# Patient Record
Sex: Male | Born: 1970 | Race: Black or African American | Hispanic: No | Marital: Married | Smoking: Former smoker
Health system: Southern US, Community
[De-identification: ages and names within clinical notes are randomized; demographics above are authoritative.]

## PROBLEM LIST (undated history)

## (undated) DIAGNOSIS — I639 Cerebral infarction, unspecified: Secondary | ICD-10-CM

## (undated) DIAGNOSIS — E7800 Pure hypercholesterolemia, unspecified: Secondary | ICD-10-CM

## (undated) DIAGNOSIS — R519 Headache, unspecified: Secondary | ICD-10-CM

## (undated) DIAGNOSIS — I10 Essential (primary) hypertension: Secondary | ICD-10-CM

## (undated) DIAGNOSIS — R51 Headache: Secondary | ICD-10-CM

## (undated) DIAGNOSIS — G4730 Sleep apnea, unspecified: Secondary | ICD-10-CM

## (undated) HISTORY — PX: NO PAST SURGERIES: SHX2092

## (undated) HISTORY — DX: Sleep apnea, unspecified: G47.30

---

## 2012-03-01 DIAGNOSIS — I639 Cerebral infarction, unspecified: Secondary | ICD-10-CM

## 2012-03-01 HISTORY — DX: Cerebral infarction, unspecified: I63.9

## 2016-03-22 ENCOUNTER — Ambulatory Visit: Payer: Self-pay | Admitting: Nurse Practitioner

## 2016-03-29 ENCOUNTER — Ambulatory Visit: Payer: Self-pay | Admitting: Nurse Practitioner

## 2017-04-24 ENCOUNTER — Emergency Department (HOSPITAL_COMMUNITY): Payer: Self-pay

## 2017-04-24 ENCOUNTER — Emergency Department (HOSPITAL_COMMUNITY)
Admission: EM | Admit: 2017-04-24 | Discharge: 2017-04-24 | Disposition: A | Discharge status: Home / Self Care | Payer: Self-pay | Attending: Emergency Medicine | Admitting: Emergency Medicine

## 2017-04-24 ENCOUNTER — Encounter (HOSPITAL_COMMUNITY): Payer: Self-pay | Admitting: Emergency Medicine

## 2017-04-24 DIAGNOSIS — J101 Influenza due to other identified influenza virus with other respiratory manifestations: Secondary | ICD-10-CM

## 2017-04-24 DIAGNOSIS — R509 Fever, unspecified: Secondary | ICD-10-CM | POA: Insufficient documentation

## 2017-04-24 DIAGNOSIS — I10 Essential (primary) hypertension: Secondary | ICD-10-CM | POA: Insufficient documentation

## 2017-04-24 DIAGNOSIS — R000 Tachycardia, unspecified: Secondary | ICD-10-CM | POA: Insufficient documentation

## 2017-04-24 DIAGNOSIS — Z8673 Personal history of transient ischemic attack (TIA), and cerebral infarction without residual deficits: Secondary | ICD-10-CM | POA: Insufficient documentation

## 2017-04-24 DIAGNOSIS — J09X9 Influenza due to identified novel influenza A virus with other manifestations: Secondary | ICD-10-CM | POA: Insufficient documentation

## 2017-04-24 DIAGNOSIS — F17210 Nicotine dependence, cigarettes, uncomplicated: Secondary | ICD-10-CM | POA: Insufficient documentation

## 2017-04-24 DIAGNOSIS — E860 Dehydration: Secondary | ICD-10-CM | POA: Insufficient documentation

## 2017-04-24 HISTORY — DX: Cerebral infarction, unspecified: I63.9

## 2017-04-24 HISTORY — DX: Headache, unspecified: R51.9

## 2017-04-24 HISTORY — DX: Essential (primary) hypertension: I10

## 2017-04-24 HISTORY — DX: Pure hypercholesterolemia, unspecified: E78.00

## 2017-04-24 HISTORY — DX: Headache: R51

## 2017-04-24 LAB — INFLUENZA PANEL BY PCR (TYPE A & B)
INFLAPCR: POSITIVE — AB
Influenza B By PCR: NEGATIVE

## 2017-04-24 LAB — BASIC METABOLIC PANEL
ANION GAP: 12 (ref 5–15)
BUN: 8 mg/dL (ref 6–20)
CO2: 24 mmol/L (ref 22–32)
Calcium: 9.3 mg/dL (ref 8.9–10.3)
Chloride: 98 mmol/L — ABNORMAL LOW (ref 101–111)
Creatinine, Ser: 1.09 mg/dL (ref 0.61–1.24)
GFR calc Af Amer: >60 mL/min (ref >60)
Glucose, Bld: 108 mg/dL — ABNORMAL HIGH (ref 65–99)
POTASSIUM: 3.8 mmol/L (ref 3.5–5.1)
SODIUM: 134 mmol/L — AB (ref 135–145)

## 2017-04-24 LAB — CBC WITH DIFFERENTIAL/PLATELET
BASOS ABS: 0 10*3/uL (ref 0.0–0.1)
BASOS PCT: 0 %
EOS PCT: 0 %
Eosinophils Absolute: 0 10*3/uL (ref 0.0–0.7)
HCT: 45.6 % (ref 39.0–52.0)
Hemoglobin: 15.7 g/dL (ref 13.0–17.0)
Lymphocytes Relative: 10 %
Lymphs Abs: 0.9 10*3/uL (ref 0.7–4.0)
MCH: 32.6 pg (ref 26.0–34.0)
MCHC: 34.4 g/dL (ref 30.0–36.0)
MCV: 94.6 fL (ref 78.0–100.0)
Monocytes Absolute: 0.4 10*3/uL (ref 0.1–1.0)
Monocytes Relative: 5 %
NEUTROS ABS: 7.4 10*3/uL (ref 1.7–7.7)
Neutrophils Relative %: 85 %
PLATELETS: 268 10*3/uL (ref 150–400)
RBC: 4.82 MIL/uL (ref 4.22–5.81)
RDW: 12.9 % (ref 11.5–15.5)
WBC: 8.7 10*3/uL (ref 4.0–10.5)

## 2017-04-24 LAB — RAPID URINE DRUG SCREEN, HOSP PERFORMED
AMPHETAMINES: NOT DETECTED
BENZODIAZEPINES: NOT DETECTED
Barbiturates: NOT DETECTED
COCAINE: NOT DETECTED
Opiates: NOT DETECTED
Tetrahydrocannabinol: NOT DETECTED

## 2017-04-24 LAB — URINALYSIS, ROUTINE W REFLEX MICROSCOPIC
BILIRUBIN URINE: NEGATIVE
GLUCOSE, UA: NEGATIVE mg/dL
HGB URINE DIPSTICK: NEGATIVE
Ketones, ur: NEGATIVE mg/dL
Leukocytes, UA: NEGATIVE
Nitrite: NEGATIVE
PH: 5 (ref 5.0–8.0)
Protein, ur: NEGATIVE mg/dL
SPECIFIC GRAVITY, URINE: 1.021 (ref 1.005–1.030)

## 2017-04-24 MED ORDER — OSELTAMIVIR PHOSPHATE 75 MG PO CAPS: 75.0000 mg | ORAL_CAPSULE | Freq: Two times a day (BID) | ORAL | 0 refills | Status: DC

## 2017-04-24 MED ORDER — ONDANSETRON HCL 4 MG/2ML IJ SOLN
4.0000 mg | Freq: Once | INTRAMUSCULAR | Status: DC
Start: 2017-04-24 — End: 2017-04-24

## 2017-04-24 MED ORDER — HYDROCODONE-ACETAMINOPHEN 5-325 MG PO TABS: 1.0000 | ORAL_TABLET | ORAL | 0 refills | Status: DC | PRN

## 2017-04-24 MED ORDER — ACETAMINOPHEN 500 MG PO TABS
1000.0000 mg | ORAL_TABLET | Freq: Once | ORAL | Status: AC
  Administered 2017-04-24: 1000 mg via ORAL
  Filled 2017-04-24: qty 2

## 2017-04-24 MED ORDER — SODIUM CHLORIDE 0.9 % IV SOLN: INTRAVENOUS | Status: DC

## 2017-04-24 MED ORDER — MORPHINE SULFATE (PF) 4 MG/ML IV SOLN: 4.0000 mg | Freq: Once | INTRAVENOUS | Status: DC

## 2017-04-24 MED ORDER — IBUPROFEN 600 MG PO TABS: 600.0000 mg | ORAL_TABLET | Freq: Four times a day (QID) | ORAL | 0 refills | Status: DC | PRN

## 2017-04-24 MED ORDER — SODIUM CHLORIDE 0.9 % IV BOLUS (SEPSIS)
1000.0000 mL | Freq: Once | INTRAVENOUS | Status: AC
  Administered 2017-04-24: 1000 mL via INTRAVENOUS

## 2017-04-24 MED ORDER — IBUPROFEN 800 MG PO TABS
800.0000 mg | ORAL_TABLET | Freq: Once | ORAL | Status: AC
  Administered 2017-04-24: 800 mg via ORAL
  Filled 2017-04-24: qty 1

## 2017-04-24 NOTE — ED Provider Notes (Signed)
[place] EMERGENCY DEPARTMENT Provider Note   CSN: [id number] Arrival date & time: 04/24/17  1114     History   Chief Complaint Chief Complaint  Patient presents with  . Seizures    HPI Aaron Crane is a 47 y.o. male.  Pt presents to the ED today with possible seizure.  He developed a fever yesterday and has not been feeling well.   The pt sat up in bed today to drink and take some meds, when his eyes rolled back and his body started shaking.  Episode lasted 8 seconds and pt awake and alert right afterwards.  No incontinence.  No seizure hx.      Past Medical History:  Diagnosis Date  . Headache   . Hypercholesteremia   . Hypertension   . Stroke [facility])     There are no active problems to display for this patient.   History reviewed. No pertinent surgical history.     Home Medications    Prior to Admission medications   Medication Sig Start Date End Date Taking? Authorizing Provider  HYDROcodone-acetaminophen (NORCO/VICODIN) 5-325 MG tablet Take 1 tablet by mouth every 4 (four) hours as needed. 04/24/17   Isla Pence, MD  ibuprofen (ADVIL,MOTRIN) 600 MG tablet Take 1 tablet (600 mg total) by mouth every 6 (six) hours as needed. 04/24/17   Isla Pence, MD  oseltamivir (TAMIFLU) 75 MG capsule Take 1 capsule (75 mg total) by mouth every 12 (twelve) hours. 04/24/17   Isla Pence, MD    Family History No family history on file.  Social History Social History   Tobacco Use  . Smoking status: Current Every Day Smoker    Packs/day: 0.50    Years: 0.00    Pack years: 0.00    Types: Cigarettes  Substance Use Topics  . Alcohol use: Yes    Frequency: Never    Comment: occasional  . Drug use: No     Allergies   Patient has no known allergies.   Review of Systems Review of Systems  Constitutional: Positive for fever.  Neurological: Positive for seizures.  All other systems reviewed and are negative.    Physical  Exam Updated Vital Signs BP (!) 143/79   Pulse 83   Temp (!) 103.4 F (39.7 C) (Oral)   Resp (!) 27   Ht 5\' 11"  (1.803 m)   Wt 110.7 kg (244 lb)   SpO2 96%   BMI 34.03 kg/m   Physical Exam  Constitutional: He is oriented to person, place, and time. He appears well-developed and well-nourished.  HENT:  Head: Normocephalic and atraumatic.  Right Ear: External ear normal.  Left Ear: External ear normal.  Nose: Nose normal.  Mouth/Throat: Mucous membranes are dry.  Eyes: Conjunctivae and EOM are normal. Pupils are equal, round, and reactive to light.  Neck: Normal range of motion. Neck supple.  Cardiovascular: Regular rhythm, normal heart sounds and intact distal pulses. Tachycardia present.  Pulmonary/Chest: Effort normal and breath sounds normal.  Abdominal: Soft. Bowel sounds are normal.  Musculoskeletal: Normal range of motion.  Neurological: He is alert and oriented to person, place, and time.  Skin: Skin is warm. Capillary refill takes less than 2 seconds.  Psychiatric: He has a normal mood and affect. His behavior is normal. Judgment and thought content normal.  Nursing note and vitals reviewed.    ED Treatments / Results  Labs (all labs ordered are listed, but only abnormal results are displayed) Labs Reviewed  BASIC  METABOLIC PANEL - Abnormal; Notable for the following components:      Result Value   Sodium 134 (*)    Chloride 98 (*)    Glucose, Bld 108 (*)    All other components within normal limits  INFLUENZA PANEL BY PCR (TYPE A & B) - Abnormal; Notable for the following components:   Influenza A By PCR POSITIVE (*)    All other components within normal limits  CBC WITH DIFFERENTIAL/PLATELET  RAPID URINE DRUG SCREEN, HOSP PERFORMED  URINALYSIS, ROUTINE W REFLEX MICROSCOPIC  CBG MONITORING, ED    EKG  EKG Interpretation None       Radiology Dg Chest 2 View  Result Date: 04/24/2017 CLINICAL DATA:  Patient from home with [facility] after seizure. Wife  states patient was on bed when "his eyes rolled back and his entire body started shaking EXAM: CHEST  2 VIEW COMPARISON:  None. FINDINGS: The cardiac silhouette is normal in size and configuration. Normal mediastinal and hilar contours. There is linear opacity at the left lung base that is likely scarring or atelectasis. Lungs are otherwise clear. No pleural effusion or pneumothorax. Skeletal structures are intact. IMPRESSION: No active cardiopulmonary disease. Electronically Signed   By: Lajean Manes M.D.   On: 04/24/2017 12:16   Ct Head Wo Contrast  Result Date: 04/24/2017 CLINICAL DATA:  First seizure this morning.  No reported injury. EXAM: CT HEAD WITHOUT CONTRAST TECHNIQUE: Contiguous axial images were obtained from the base of the skull through the vertex without intravenous contrast. COMPARISON:  None. FINDINGS: Brain: No evidence of parenchymal hemorrhage or extra-axial fluid collection. No mass lesion, mass effect, or midline shift. No CT evidence of acute infarction. Cerebral volume is age appropriate. No ventriculomegaly. Vascular: No acute abnormality. Skull: No evidence of calvarial fracture. Sinuses/Orbits: Mucous retention cysts versus polyps in the bilateral maxillary sinuses, left greater than right. No fluid levels. Other:  The mastoid air cells are unopacified. IMPRESSION: 1.  No evidence of acute intracranial abnormality. 2. Bilateral maxillary sinus mucous retention cysts versus polyps, left greater than right. Electronically Signed   By: Ilona Sorrel M.D.   On: 04/24/2017 12:12    Procedures Procedures (including critical care time)  Medications Ordered in ED Medications  sodium chloride 0.9 % bolus 1,000 mL (0 mLs Intravenous Stopped 04/24/17 1249)    And  0.9 %  sodium chloride infusion (not administered)  ondansetron (ZOFRAN) injection 4 mg (not administered)  morphine 4 MG/ML injection 4 mg (not administered)  acetaminophen (TYLENOL) tablet 1,000 mg (1,000 mg Oral Given  04/24/17 1146)  ibuprofen (ADVIL,MOTRIN) tablet 800 mg (800 mg Oral Given 04/24/17 1146)     Initial Impression / Assessment and Plan / ED Course  I have reviewed the triage vital signs and the nursing notes.  Pertinent labs & imaging results that were available during my care of the patient were reviewed by me and considered in my medical decision making (see chart for details).   Pt is feeling much better.  His seizure was likely vasovagal response from hypotension due to dehydration.  Pt knows to return if worse and to establish primary care.  Final Clinical Impressions(s) / ED Diagnoses   Final diagnoses:  Influenza A  Dehydration  Fever, unspecified fever cause    ED Discharge Orders        Ordered    oseltamivir (TAMIFLU) 75 MG capsule  Every 12 hours     04/24/17 1420    ibuprofen (ADVIL,MOTRIN) 600 MG tablet  Every 6 hours PRN     04/24/17 1420    HYDROcodone-acetaminophen (NORCO/VICODIN) 5-325 MG tablet  Every 4 hours PRN     04/24/17 1420       Isla Pence, MD 04/24/17 1421

## 2017-04-24 NOTE — ED Triage Notes (Signed)
Patient from home with [facility] after seizure. Wife states patient was on bed when "his eyes rolled back and his entire body started shaking". Entire episode lasted approximately 8 seconds, patient alert immediately afterward. 20g saline lock in left AC.

## 2017-05-12 ENCOUNTER — Other Ambulatory Visit: Payer: Self-pay

## 2017-05-12 ENCOUNTER — Encounter (HOSPITAL_COMMUNITY): Payer: Self-pay | Admitting: Emergency Medicine

## 2017-05-12 ENCOUNTER — Emergency Department (HOSPITAL_COMMUNITY)
Admission: EM | Admit: 2017-05-12 | Discharge: 2017-05-12 | Disposition: A | Discharge status: Home / Self Care | Payer: No Typology Code available for payment source | Attending: Emergency Medicine | Admitting: Emergency Medicine

## 2017-05-12 ENCOUNTER — Emergency Department (HOSPITAL_COMMUNITY): Payer: No Typology Code available for payment source

## 2017-05-12 DIAGNOSIS — M25511 Pain in right shoulder: Secondary | ICD-10-CM | POA: Insufficient documentation

## 2017-05-12 DIAGNOSIS — I10 Essential (primary) hypertension: Secondary | ICD-10-CM | POA: Diagnosis not present

## 2017-05-12 DIAGNOSIS — Z79899 Other long term (current) drug therapy: Secondary | ICD-10-CM | POA: Diagnosis not present

## 2017-05-12 DIAGNOSIS — F17210 Nicotine dependence, cigarettes, uncomplicated: Secondary | ICD-10-CM | POA: Insufficient documentation

## 2017-05-12 MED ORDER — METHOCARBAMOL 500 MG PO TABS: 500.0000 mg | ORAL_TABLET | Freq: Two times a day (BID) | ORAL | 0 refills | Status: DC | PRN

## 2017-05-12 MED ORDER — IBUPROFEN 800 MG PO TABS: 800.0000 mg | ORAL_TABLET | Freq: Three times a day (TID) | ORAL | 0 refills | Status: DC | PRN
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
## 2017-05-12 NOTE — ED Notes (Signed)
ED Provider at bedside. 

## 2017-05-12 NOTE — Discharge Instructions (Signed)
It was my pleasure taking care of you today!  Ibuprofen as needed for pain. Robaxin is your muscle relaxer to take twice daily as needed. Ice shoulder throughout the day (instructions below).  Wear shoulder sling for no more than 3 days, then begin performing gentle range of motion exercises.   Call the orthopedist listed if symptoms are not improved in one week.   Return to the ER for new or worsening symptoms, any additional concerns.  COLD THERAPY DIRECTIONS:  Ice or gel packs can be used to reduce both pain and swelling. Ice is the most helpful within the first 24 to 48 hours after an injury or flareup from overusing a muscle or joint.  Ice is effective, has very few side effects, and is safe for most people to use.   If you expose your skin to cold temperatures for too long or without the proper protection, you can damage your skin or nerves. Watch for signs of skin damage due to cold.   HOME CARE INSTRUCTIONS  Follow these tips to use ice and cold packs safely.  Place a dry or damp towel between the ice and skin. A damp towel will cool the skin more quickly, so you may need to shorten the time that the ice is used.  For a more rapid response, add gentle compression to the ice.  Ice for no more than 10 to 20 minutes at a time. The bonier the area you are icing, the less time it will take to get the benefits of ice.  Check your skin after 5 minutes to make sure there are no signs of a poor response to cold or skin damage.  Rest 20 minutes or more in between uses.  Once your skin is numb, you can end your treatment. You can test numbness by very lightly touching your skin. The touch should be so light that you do not see the skin dimple from the pressure of your fingertip. When using ice, most people will feel these normal sensations in this order: cold, burning, aching, and numbness.
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
## 2017-06-28 DIAGNOSIS — M25511 Pain in right shoulder: Secondary | ICD-10-CM | POA: Insufficient documentation

## 2018-03-24 ENCOUNTER — Telehealth: Payer: Self-pay

## 2018-03-24 NOTE — Telephone Encounter (Signed)
SENT REFERRAL TO SCHEDULING AND FILED NOTES 

## 2018-04-04 ENCOUNTER — Telehealth: Payer: Self-pay | Admitting: Cardiology

## 2018-04-04 NOTE — Telephone Encounter (Signed)
NOTES FAXED TO [facility] FROM [email or web address] [phone]

## 2018-04-25 ENCOUNTER — Ambulatory Visit (INDEPENDENT_AMBULATORY_CARE_PROVIDER_SITE_OTHER): Payer: Self-pay | Admitting: Cardiology

## 2018-04-25 ENCOUNTER — Encounter: Payer: Self-pay | Admitting: Cardiology

## 2018-04-25 VITALS — BP 112/72 | HR 81 | Ht 71.0 in | Wt 256.8 lb

## 2018-04-25 DIAGNOSIS — Z7189 Other specified counseling: Secondary | ICD-10-CM

## 2018-04-25 DIAGNOSIS — Z713 Dietary counseling and surveillance: Secondary | ICD-10-CM

## 2018-04-25 DIAGNOSIS — G4733 Obstructive sleep apnea (adult) (pediatric): Secondary | ICD-10-CM

## 2018-04-25 DIAGNOSIS — Z6835 Body mass index (BMI) 35.0-35.9, adult: Secondary | ICD-10-CM

## 2018-04-25 DIAGNOSIS — Z8673 Personal history of transient ischemic attack (TIA), and cerebral infarction without residual deficits: Secondary | ICD-10-CM

## 2018-04-25 DIAGNOSIS — E782 Mixed hyperlipidemia: Secondary | ICD-10-CM

## 2018-04-25 DIAGNOSIS — I10 Essential (primary) hypertension: Secondary | ICD-10-CM

## 2018-04-25 DIAGNOSIS — E6609 Other obesity due to excess calories: Secondary | ICD-10-CM

## 2018-04-25 DIAGNOSIS — Z716 Tobacco abuse counseling: Secondary | ICD-10-CM

## 2018-04-25 NOTE — Progress Notes (Signed)
Cardiology Office Note:    Date:  04/25/2018   ID:  Aaron Crane, DOB 1970-06-27, MRN [id number]  PCP:  Caswell Corwin, [place] Cardiologist:  Buford Dresser, MD PhD  Referring MD: Maylon Cos, NP   CC: establish care  History of Present Illness:    Aaron Crane is a 48 y.o. male with a hx of hypertension, hyperlipidemia, sleep apnea using CPAP, history of stroke, tobacco abuse who is seen as a new consult at the request of Maylon Cos, NP for the evaluation and management of cardiovascular risk factors  Received records from [facility] re: history. Reported stroke in 2015, but no records available (PCP requesting from [place]), unclear etiology. Reports it as [facility] in [place] ([place], not able to find records).  Reports that at the time of his stroke, was in a time of high stress. Was not eating well, had high blood pressure. Does not think it was a hemorrhagic stroke.  Moved to [place] in 2016, been doing well. Takes a baby aspirin every day. Started exercising again, does calisthenics. 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 Started a diet, no salt, whole grains, more vegetable, cutting back on eggs, red meat, mayo.   Working on quitting smoking, down from 1 ppd to 1/3-1/4 ppd. Smoking for 26 years. Longest he has ever quit was 2 years. Not using aids, just cutting back slowly. Drinks 1-2 drinks/week, used to be heavier.  Has had hypertension since 2009, initially was high but improved with therapy. Has been well controlled since then.   Denies chest pain, shortness of breath at rest or with normal exertion. No PND, orthopnea, LE edema or unexpected weight gain. No syncope or palpitations.  Past Medical History:  Diagnosis Date  . Headache   . Hypercholesteremia   . Hypertension   . Stroke [facility])     History reviewed. No pertinent surgical history.  Current Medications: Current Outpatient Medications on  File Prior to Visit  Medication Sig  . aspirin EC 81 MG tablet Take 81 mg by mouth daily.  Marland Kitchen atorvastatin (LIPITOR) 40 MG tablet TAKE 2 TABLETS BY MOUTH IN THE EVENING  . lisinopril-hydrochlorothiazide (PRINZIDE,ZESTORETIC) 20-25 MG tablet lisinopril 20 mg-hydrochlorothiazide 25 mg tablet  TAKE 1 TABLET BY MOUTH ONCE DAILY IN THE MORNING  . meloxicam (MOBIC) 15 MG tablet TAKE 1 TABLET BY MOUTH ONCE DAILY WITH FOOD  . sildenafil (REVATIO) 20 MG tablet Take 20 mg by mouth as needed.   No current facility-administered medications on file prior to visit.      Allergies:   Patient has no known allergies.   Social History   Socioeconomic History  . Marital status: Married    Spouse name: Not on file  . Number of children: Not on file  . Years of education: Not on file  . Highest education level: Not on file  Occupational History  . Not on file  Social Needs  . Financial resource strain: Not on file  . Food insecurity:    Worry: Not on file    Inability: Not on file  . Transportation needs:    Medical: Not on file    Non-medical: Not on file  Tobacco Use  . Smoking status: Current Every Day Smoker    Packs/day: 0.50    Years: 0.00    Pack years: 0.00    Types: Cigarettes  . Smokeless tobacco: Never Used  Substance and Sexual Activity  . Alcohol use:  Yes    Frequency: Never    Comment: occasional  . Drug use: No  . Sexual activity: Not on file  Lifestyle  . Physical activity:    Days per week: Not on file    Minutes per session: Not on file  . Stress: Not on file  Relationships  . Social connections:    Talks on phone: Not on file    Gets together: Not on file    Attends religious service: Not on file    Active member of club or organization: Not on file    Attends meetings of clubs or organizations: Not on file    Relationship status: Not on file  Other Topics Concern  . Not on file  Social History Narrative  . Not on file     Family History: Lot of  hypertension, some cholesterol. No MI in his family, several family members with strokes. Doesn't know father's history, mother has hypertension. 2 sisters, one sister with hypertension, obesity, the other is healthy.  ROS:   Please see the history of present illness.  Additional pertinent ROS:  Constitutional: Negative for chills, fever, night sweats, unintentional weight loss  HENT: Negative for ear pain and hearing loss.   Eyes: Negative for loss of vision and eye pain.  Respiratory: Negative for cough, sputum, shortness of breath, wheezing.   Cardiovascular: See HPI. Gastrointestinal: Negative for abdominal pain, melena, and hematochezia.  Genitourinary: Negative for dysuria and hematuria.  Musculoskeletal: Negative for falls and myalgias.  Skin: Negative for itching and rash.  Neurological: Negative for focal weakness, focal sensory changes and loss of consciousness.  Endo/Heme/Allergies: Does not bruise/bleed easily.    EKGs/Labs/Other Studies Reviewed:    The following studies were reviewed today: Notes from PCP  EKG:  EKG is personally reviewed.  The ekg ordered today demonstrates normal sinus rhythm, nonspecific TW changes  Recent Labs: No results found for requested labs within last 8760 hours.  Recent Lipid Panel No results found for: CHOL, TRIG, HDL, CHOLHDL, VLDL, LDLCALC, LDLDIRECT  From PCP notes, 03/20/2018: Normal CBC, normal CMP, normal TSH Lipids Tchol 231, TG 232, HDL 48, LDL 137 (on atorvastatin 40 mg)   Physical Exam:    VS:  BP 112/72 (BP Location: Right Arm)   Pulse 81   Ht 5\' 11"  (1.803 m)   Wt 256 lb 12.8 oz (116.5 kg)   SpO2 98%   BMI 35.82 kg/m     Wt Readings from Last 3 Encounters:  04/25/18 256 lb 12.8 oz (116.5 kg)  05/12/17 245 lb (111.1 kg)  04/24/17 244 lb (110.7 kg)     GEN: Well nourished, well developed in no acute distress HEENT: Normal NECK: No JVD; No carotid bruits LYMPHATICS: No lymphadenopathy CARDIAC: regular rhythm,  normal S1 and S2, no murmurs, rubs, gallops. Radial and DP pulses 2+ bilaterally. RESPIRATORY:  Clear to auscultation without rales, wheezing or rhonchi  ABDOMEN: Soft, non-tender, non-distended MUSCULOSKELETAL:  No edema; No deformity  SKIN: Warm and dry NEUROLOGIC:  Alert and oriented x 3 PSYCHIATRIC:  Normal affect   ASSESSMENT:    1. History of stroke    PLAN:    Hypertension: wife checks at home, runs 120/80. Diagnosed around 2009. -continue lisinopril-HCTZ 20-25mg   History of stroke: on secondary prevention -on aspirin 81 mg daily -increased to 80 mg atorvastatin daily recently  Tobacco abuse: The patient was counseled on tobacco cessation today for 5 minutes.  Counseling included reviewing the risks of smoking tobacco products, how  it impacts the patient's current medical diagnoses and different strategies for quitting.  Pharmacotherapy to aid in tobacco cessation was not prescribed today.  Obstructive sleep apnea: using CPAP  Secondary prevention: -recommend heart healthy/Mediterranean diet, with whole grains, fruits, vegetable, fish, lean meats, nuts, and olive oil. Limit salt. -recommend moderate walking, 3-5 times/week for 30-50 minutes each session. Aim for at least 150 minutes.week. Goal should be pace of 3 miles/hours, or walking 1.5 miles in 30 minutes -recommend avoidance of tobacco products. Avoid excess alcohol. -Additional risk factor control:  -Diabetes: A1c is not available, denies history  -Hyperlipidemia: LDL was 137 on 40 mg atorvastatin. Just recently increased to 80 mg atorvastatin daily.   -Blood pressure control: as above  -Weight: BMI 35, discussed importance of weight loss  Plan for follow up: 12 mos or sooner PRN  Medication Adjustments/Labs and Tests Ordered: Current medicines are reviewed at length with the patient today.  Concerns regarding medicines are outlined above.  Orders Placed This Encounter  Procedures  . EKG 12-Lead   No orders  of the defined types were placed in this encounter.   Patient Instructions  Medication Instructions:  Your Physician recommend you continue on your current medication as directed.    If you need a refill on your cardiac medications before your next appointment, please call your pharmacy.   Lab work: None  Testing/Procedures: None  Follow-Up: At [facility], you and your health needs are our priority.  As part of our continuing mission to provide you with exceptional heart care, we have created designated Provider Care Teams.  These Care Teams include your primary Cardiologist (physician) and Advanced Practice Providers (APPs -  Physician Assistants and Nurse Practitioners) who all work together to provide you with the care you need, when you need it. You will need a follow up appointment in 1 years.  Please call our office 2 months in advance to schedule this appointment.  You may see Buford Dresser, MD or one of the following Advanced Practice Providers on your designated Care Team:   Rosaria Ferries, PA-C . Jory Sims, DNP, ANP       Signed, Buford Dresser, MD PhD 04/25/2018 9:11 AM    [place]

## 2018-04-25 NOTE — Patient Instructions (Signed)

## 2019-02-13 ENCOUNTER — Telehealth: Payer: Self-pay | Admitting: *Deleted

## 2019-02-13 NOTE — Telephone Encounter (Signed)
Unable to leave a message, phone is busy 

## 2019-03-27 IMAGING — CT CT HEAD W/O CM
3 series · 15 of 47 positions shown, 18 images · non-contrast
Comparison: None.

CLINICAL DATA: First seizure this morning.  No reported injury.

EXAM:
CT HEAD WITHOUT CONTRAST
TECHNIQUE: Contiguous axial images were obtained from the base of the skull
through the vertex without intravenous contrast.

[Series 3: head 5.0 h30s · axial · 0.49mm/px · z∈[-109,+36]mm · 9 of 35 slices shown, 12 images]
[im 3/35  brain]
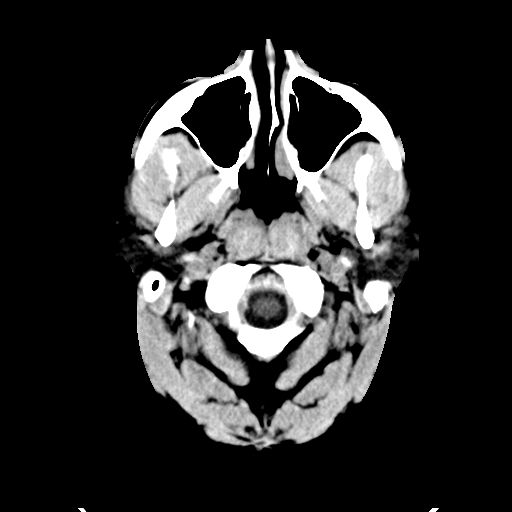
[im 3/35  bone]
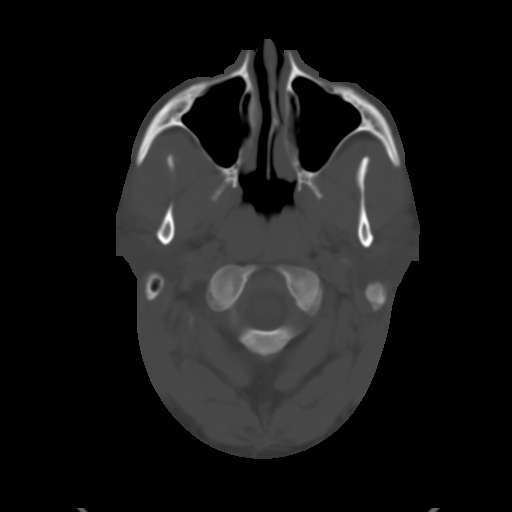
[im 6/35  brain]
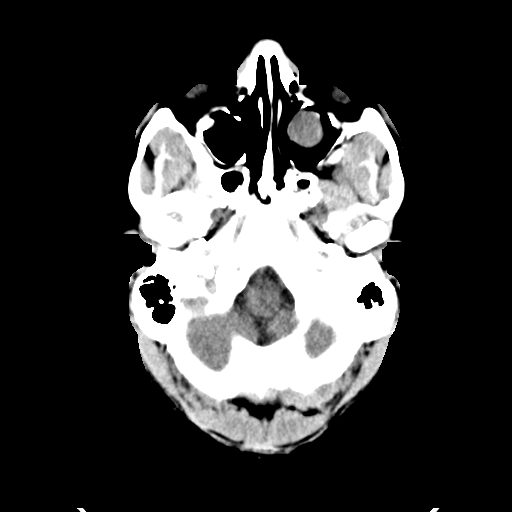
[im 10/35  brain]
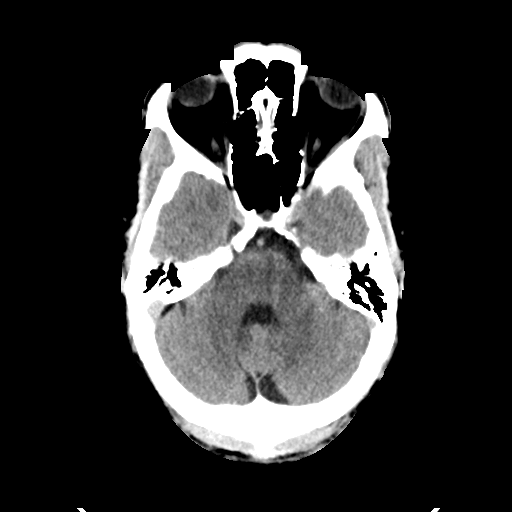
[im 13/35  brain]
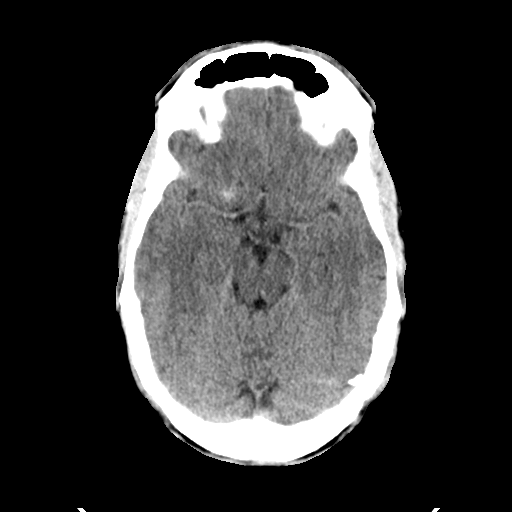
[im 18/35  brain]
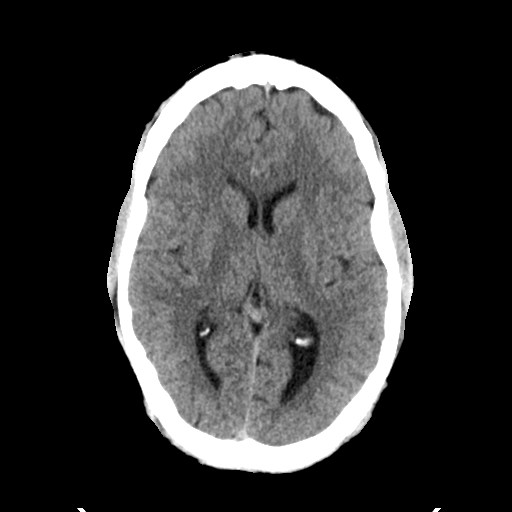
[im 18/35  bone]
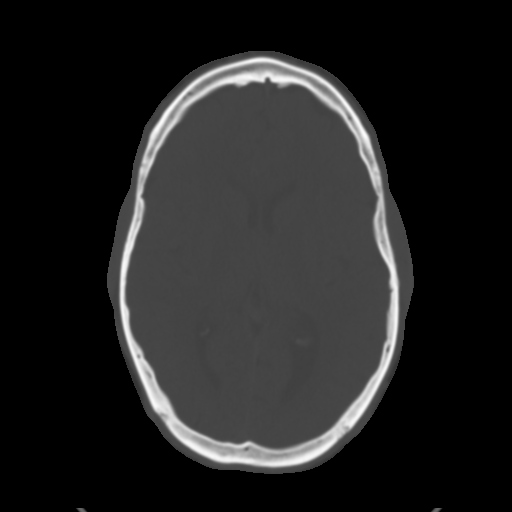
[im 22/35  brain]
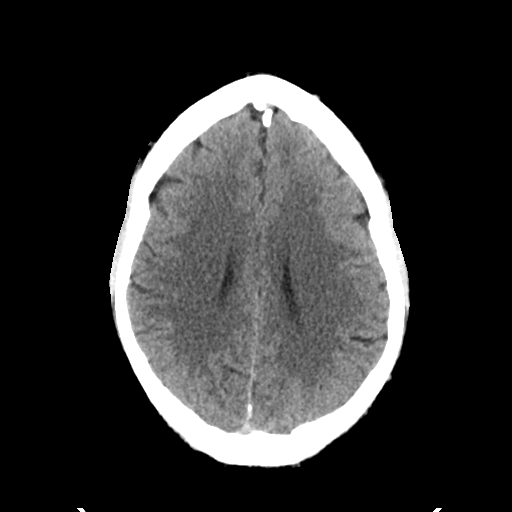
[im 25/35  brain]
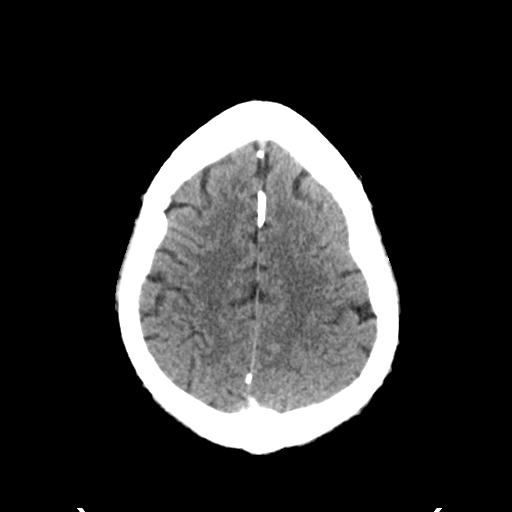
[im 29/35  brain]
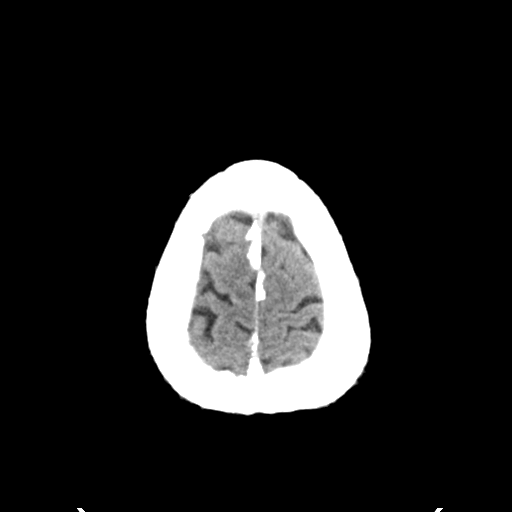
[im 32/35  brain]
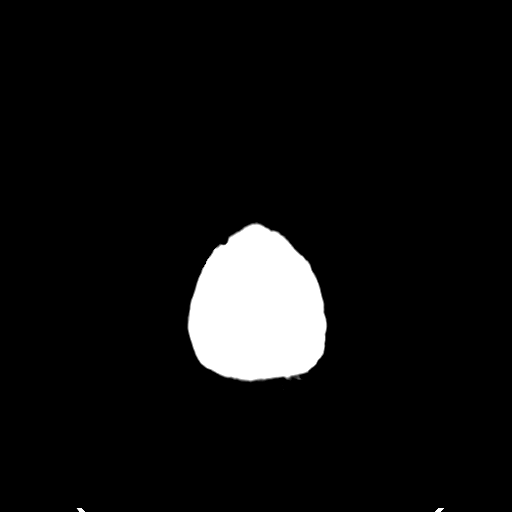
[im 32/35  bone]
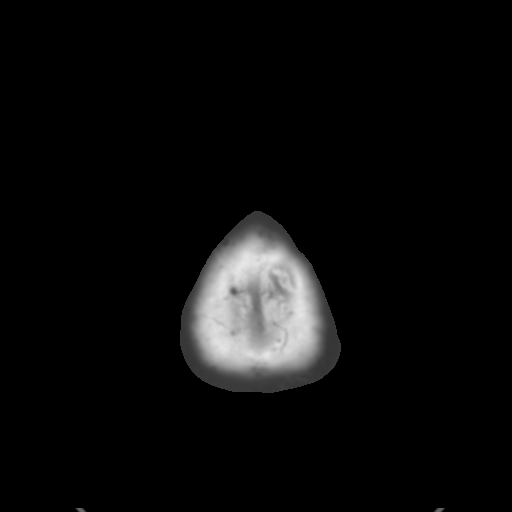

[Series 5: head 3.0 mpr cor · coronal · 0.35mm/px · 3 of 80 slices shown]
[im 27/80  brain]
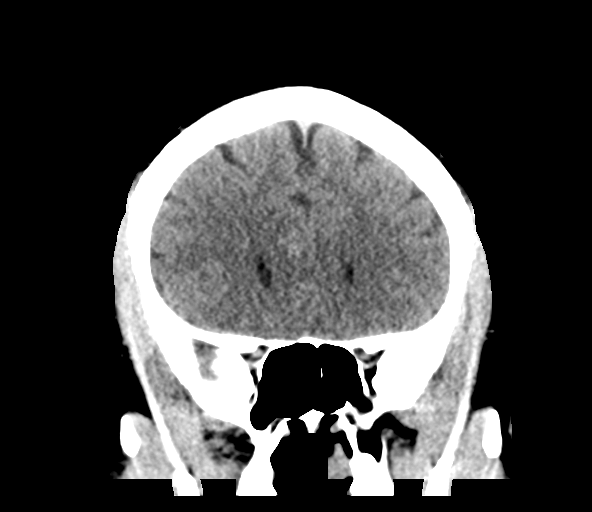
[im 36/80  brain]
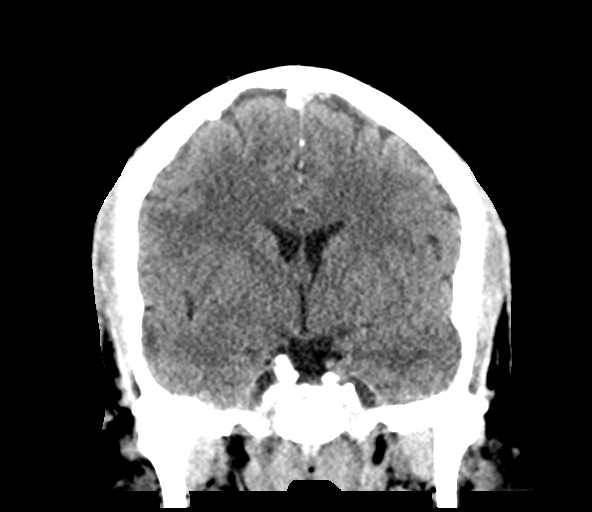
[im 44/80  brain]
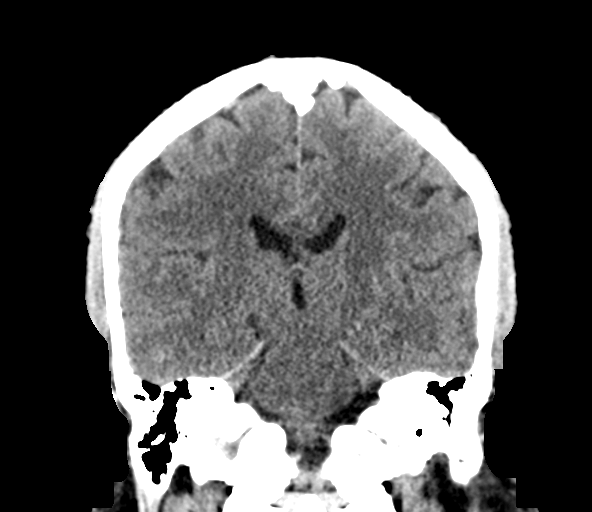

[Series 6: head 3.0 mpr sag · sagittal · 0.38mm/px · 3 of 67 slices shown]
[im 23/67  brain]
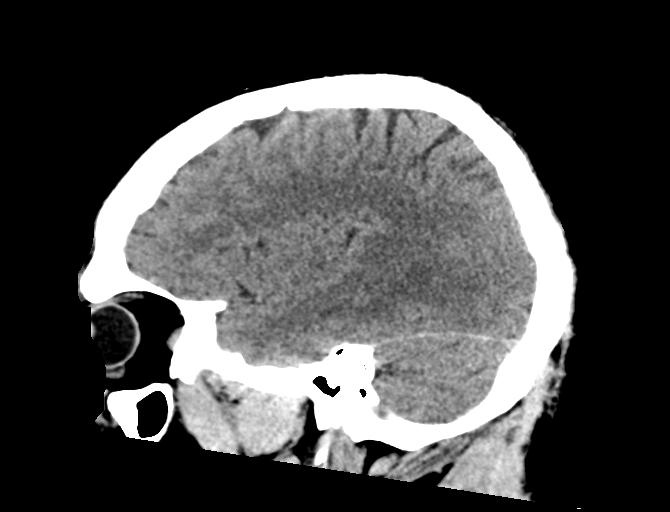
[im 34/67  brain]
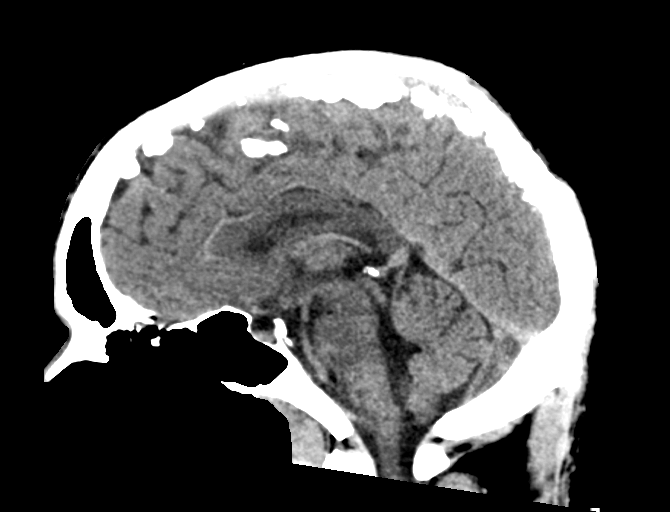
[im 45/67  brain]
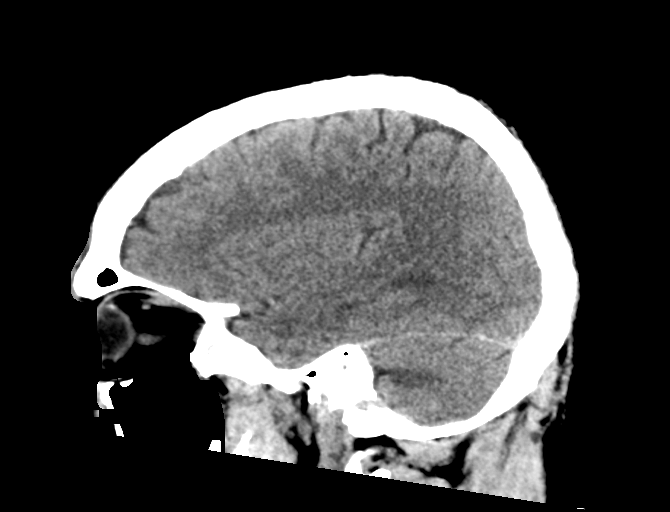

[15 of 47 positions shown; findings below may reference images not displayed]

FINDINGS: Brain: No evidence of parenchymal hemorrhage or extra-axial fluid
collection. No mass lesion, mass effect, or midline shift. No CT
evidence of acute infarction. Cerebral volume is age appropriate. No
ventriculomegaly.

Vascular: No acute abnormality.

Skull: No evidence of calvarial fracture.

Sinuses/Orbits: Mucous retention cysts versus polyps in the
bilateral maxillary sinuses, left greater than right. No fluid
levels.

Other:  The mastoid air cells are unopacified.
IMPRESSION: 1.  No evidence of acute intracranial abnormality.
2. Bilateral maxillary sinus mucous retention cysts versus polyps,
left greater than right.

## 2019-04-09 ENCOUNTER — Encounter (INDEPENDENT_AMBULATORY_CARE_PROVIDER_SITE_OTHER): Payer: Self-pay

## 2019-04-09 ENCOUNTER — Other Ambulatory Visit: Payer: Self-pay

## 2019-04-09 ENCOUNTER — Encounter: Payer: Self-pay | Admitting: Cardiology

## 2019-04-09 ENCOUNTER — Ambulatory Visit (INDEPENDENT_AMBULATORY_CARE_PROVIDER_SITE_OTHER): Payer: Self-pay | Admitting: Cardiology

## 2019-04-09 VITALS — BP 122/72 | HR 87 | Ht 71.0 in | Wt 263.6 lb

## 2019-04-09 DIAGNOSIS — Z7189 Other specified counseling: Secondary | ICD-10-CM

## 2019-04-09 DIAGNOSIS — Z8673 Personal history of transient ischemic attack (TIA), and cerebral infarction without residual deficits: Secondary | ICD-10-CM

## 2019-04-09 DIAGNOSIS — R079 Chest pain, unspecified: Secondary | ICD-10-CM

## 2019-04-09 DIAGNOSIS — Z716 Tobacco abuse counseling: Secondary | ICD-10-CM

## 2019-04-09 DIAGNOSIS — I10 Essential (primary) hypertension: Secondary | ICD-10-CM

## 2019-04-09 DIAGNOSIS — E782 Mixed hyperlipidemia: Secondary | ICD-10-CM

## 2019-04-09 NOTE — Progress Notes (Signed)
Cardiology Office Note:    Date:  04/09/2019   ID:  Aaron Crane, DOB September 17, 1970, MRN [id number]  PCP:  Caswell Corwin, [place] Cardiologist:  Buford Dresser, MD PhD  Referring MD: Maylon Cos, NP   CC: follow up  History of Present Illness:    Aaron Crane is a 49 y.o. male with a hx of hypertension, hyperlipidemia, sleep apnea using CPAP, history of stroke, tobacco abuse who is seen in follow up. He was initially seen 04/25/2018 as a new consult at the request of Landry Mellow Azzie Glatter, NP for the evaluation and management of cardiovascular risk factors  Received records from [facility] re: history. Reported stroke in 2015, but no records available (PCP requesting from [place]), unclear etiology. Reports it as [facility] in [place] ([place], not able to find records).  Reports that at the time of his stroke, was in a time of high stress. Was not eating well, had high blood pressure. Does not think it was a hemorrhagic stroke.  Moved to [place] in 2016, been doing well. Takes a baby aspirin every day. Started exercising again, does calisthenics. 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 Started a diet, no salt, whole grains, more vegetable, cutting back on eggs, red meat, mayo.   Working on quitting smoking, down from 1 ppd to 1/3-1/4 ppd. Smoking for 26 years. Longest he has ever quit was 2 years. Not using aids, just cutting back slowly. Drinks 1-2 drinks/week, used to be heavier.  Has had hypertension since 2009, initially was high but improved with therapy. Has been well controlled since then.   Denies chest pain, shortness of breath at rest or with normal exertion. No PND, orthopnea, LE edema or unexpected weight gain. No syncope or palpitations.  Today: Has struggled with diet during the pandemic, gained some weight. He noted that it has affected his cholesterol. Feels like his energy is low. Was working, felt some chest pain,  eased off. Felt some tingling in left arm/crook in neck one night when he was laying down in bed. Chest pain was sharp, mid chest. Felt like "gas pains." Was on and off for three days, not clearly related to exertion. Initial day had some fast heart beats, but none since. Doesn't remember his numbers but thinks blood pressure was normal. Wife wanted to take him to ER but he declined. No chest pain since late last week. Has not had pain like this before.  Using his CPAP, breathing at night unchanged.   Denies shortness of breath at rest or with normal exertion. No PND, orthopnea, LE edema. No syncope.  Past Medical History:  Diagnosis Date  . Headache   . Hypercholesteremia   . Hypertension   . Stroke [facility])     No past surgical history on file.  Current Medications: Current Outpatient Medications on File Prior to Visit  Medication Sig  . aspirin EC 81 MG tablet Take 81 mg by mouth daily.  Marland Kitchen atorvastatin (LIPITOR) 40 MG tablet TAKE 2 TABLETS BY MOUTH IN THE EVENING  . lisinopril-hydrochlorothiazide (PRINZIDE,ZESTORETIC) 20-25 MG tablet lisinopril 20 mg-hydrochlorothiazide 25 mg tablet  TAKE 1 TABLET BY MOUTH ONCE DAILY IN THE MORNING  . meloxicam (MOBIC) 15 MG tablet TAKE 1 TABLET BY MOUTH ONCE DAILY WITH FOOD  . sildenafil (REVATIO) 20 MG tablet Take 20 mg by mouth as needed.   No current facility-administered medications on file prior to visit.     Allergies:   Patient  has no known allergies.   Social History   Tobacco Use  . Smoking status: Current Every Day Smoker    Packs/day: 0.50    Years: 0.00    Pack years: 0.00    Types: Cigarettes  . Smokeless tobacco: Never Used  Substance Use Topics  . Alcohol use: Yes    Comment: occasional  . Drug use: No    Family History: Lot of hypertension, some cholesterol. No MI in his family, several family members with strokes. Doesn't know father's history, mother has hypertension. 2 sisters, one sister with hypertension, obesity,  the other is healthy.  ROS:   Please see the history of present illness.  Additional pertinent ROS negative except as noted.   EKGs/Labs/Other Studies Reviewed:    The following studies were reviewed today: Notes from PCP  EKG:  EKG is personally reviewed.  The ekg ordered today demonstrates normal sinus rhythm, nonspecific TW changes  Recent Labs: No results found for requested labs within last 8760 hours.  Recent Lipid Panel No results found for: CHOL, TRIG, HDL, CHOLHDL, VLDL, LDLCALC, LDLDIRECT  From PCP notes, 03/20/2018: Normal CBC, normal CMP, normal TSH Lipids Tchol 231, TG 232, HDL 48, LDL 137 (on atorvastatin 40 mg)   Physical Exam:    VS:  BP 122/72   Pulse 87   Ht 5\' 11"  (1.803 m)   Wt 263 lb 9.6 oz (119.6 kg)   SpO2 98%   BMI 36.76 kg/m     Wt Readings from Last 3 Encounters:  04/09/19 263 lb 9.6 oz (119.6 kg)  04/25/18 256 lb 12.8 oz (116.5 kg)  05/12/17 245 lb (111.1 kg)    GEN: Well nourished, well developed in no acute distress HEENT: Normal, moist mucous membranes NECK: No JVD CARDIAC: regular rhythm, normal S1 and S2, no rubs or gallops. No murmur. VASCULAR: Radial and DP pulses 2+ bilaterally. No carotid bruits RESPIRATORY:  Clear to auscultation without rales, wheezing or rhonchi  ABDOMEN: Soft, non-tender, non-distended MUSCULOSKELETAL:  Ambulates independently SKIN: Warm and dry, no edema NEUROLOGIC:  Alert and oriented x 3. No focal neuro deficits noted. PSYCHIATRIC:  Normal affect   ASSESSMENT:    1. Chest pain, unspecified type   2. History of stroke   3. Essential hypertension   4. Tobacco abuse counseling   5. Mixed hyperlipidemia   6. Cardiac risk counseling   7. Counseling on health promotion and disease prevention    PLAN:    Chest pain: there are both typical and atypical symptoms. He does have significant risk factors, including prior stroke -discussed treadmill stress, nuclear stress/lexiscan, and CT coronary angiography.  Discussed pros and cons of each, including but not limited to false positive/false negative risk, radiation risk, and risk of IV contrast dye. Based on shared decision making, decision was made to pursue exercise treadmill due to concerns for cost (he does not have insurance). -if treadmill abnormal, would consider CT. Will need to determine how to make this affordable for him.  Hypertension: wife checks at home, runs 120/80. Diagnosed around 2009. -at goal today -continue lisinopril-HCTZ 20-25mg   History of stroke: on secondary prevention -on aspirin 81 mg daily -continue 80 mg atorvastatin daily -if not at goal, consider ezetimibe, PCSK9i, or nexletol  Tobacco abuse: The patient was counseled on tobacco cessation today for 4 minutes.  Counseling included reviewing the risks of smoking tobacco products, how it impacts the patient's current medical diagnoses and different strategies for quitting.  Pharmacotherapy to aid in tobacco  cessation was not prescribed today.  Obstructive sleep apnea: using CPAP  Secondary prevention: -recommend heart healthy/Mediterranean diet, with whole grains, fruits, vegetable, fish, lean meats, nuts, and olive oil. Limit salt. -recommend moderate walking, 3-5 times/week for 30-50 minutes each session. Aim for at least 150 minutes.week. Goal should be pace of 3 miles/hours, or walking 1.5 miles in 30 minutes -recommend avoidance of tobacco products. Avoid excess alcohol. -Additional risk factor control:  -Diabetes: A1c is not available, denies history  -Hyperlipidemia: LDL was 137 on 40 mg atorvastatin. Recheck at follow up if not done prior.  -Blood pressure control: as above  -Weight: BMI 36, discussed importance of weight loss  Plan for follow up: 3 mos or sooner PRN based on results of testing  Medication Adjustments/Labs and Tests Ordered: Current medicines are reviewed at length with the patient today.  Concerns regarding medicines are outlined above.    Orders Placed This Encounter  Procedures  . EXERCISE TOLERANCE TEST (ETT)  . EKG 12-Lead   No orders of the defined types were placed in this encounter.   Patient Instructions  Medication Instructions:  Your Physician recommend you continue on your current medication as directed.    *If you need a refill on your cardiac medications before your next appointment, please call your pharmacy*  Lab Work: None  Testing/Procedures: Your physician has requested that you have an exercise tolerance test. For further information please visit [facility]uge[employer]iest[employer][email or web address]. Please also follow instruction sheet, as given. [place]. [address]  You will need to have the coronavirus test completed prior to your procedure.  This is a Drive Up Visit at the [employer] [address]. Someone will direct you to the appropriate testing line. Please tell them that you are there for procedure testing. Stay in your car and someone will be with you shortly. Please make sure to have all other labs completed before this test because you will need to stay quarantined until your procedure.    Follow-Up: At [facility], you and your health needs are our priority.  As part of our continuing mission to provide you with exceptional heart care, we have created designated Provider Care Teams.  These Care Teams include your primary Cardiologist (physician) and Advanced Practice Providers (APPs -  Physician Assistants and Nurse Practitioners) who all work together to provide you with the care you need, when you need it.  Your next appointment:   3 month(s)  The format for your next appointment:   In Person  Provider:   Buford Dresser, MD    [facility] at [facility] [address] [place], [place] [address] Phone:  [phone]        You are scheduled for an Exercise Stress Test  Please arrive 15 minutes prior to your appointment time  for registration and insurance purposes.  The test will take approximately 45 minutes to complete.  How to prepare for your Exercise Stress Test: . Do bring a list of your current medications with you.  If not listed below, you may take your medications as normal. . Do wear comfortable clothes (no dresses or overalls) and walking shoes, tennis shoes preferred (no heels or open toed shoes are allowed) . Do Not wear cologne, perfume, aftershave or lotions (deodorant is allowed). . Please report to [place], [address] for your test.  If these instructions are not followed, your test will have to be rescheduled.  If you have questions or  concerns about your appointment, you can call the Stress Lab at [phone].  If you cannot keep your appointment, please provide 24 hours notification to the Stress Lab, to avoid a possible $50 charge to your account      Signed, Buford Dresser, MD PhD 04/09/2019  [place]

## 2019-04-09 NOTE — Patient Instructions (Signed)
Medication Instructions:  Your Physician recommend you continue on your current medication as directed.    *If you need a refill on your cardiac medications before your next appointment, please call your pharmacy*  Lab Work: None  Testing/Procedures: Your physician has requested that you have an exercise tolerance test. For further information please visit [email or web address]. Please also follow instruction sheet, as given. [place]. [address]  You will need to have the coronavirus test completed prior to your procedure.  This is a Drive Up Visit at the [employer] [address]. Someone will direct you to the appropriate testing line. Please tell them that you are there for procedure testing. Stay in your car and someone will be with you shortly. Please make sure to have all other labs completed before this test because you will need to stay quarantined until your procedure.    Follow-Up: At [facility], you and your health needs are our priority.  As part of our continuing mission to provide you with exceptional heart care, we have created designated Provider Care Teams.  These Care Teams include your primary Cardiologist (physician) and Advanced Practice Providers (APPs -  Physician Assistants and Nurse Practitioners) who all work together to provide you with the care you need, when you need it.  Your next appointment:   3 month(s)  The format for your next appointment:   In Person  Provider:   Buford Dresser, MD    [facility] at [facility] [address] [place], [place] [address] Phone:  [phone]        You are scheduled for an Exercise Stress Test  Please arrive 15 minutes prior to your appointment time for registration and insurance purposes.  The test will take approximately 45 minutes to complete.  How to prepare for your Exercise Stress Test: . Do bring a list of your current  medications with you.  If not listed below, you may take your medications as normal. . Do wear comfortable clothes (no dresses or overalls) and walking shoes, tennis shoes preferred (no heels or open toed shoes are allowed) . Do Not wear cologne, perfume, aftershave or lotions (deodorant is allowed). . Please report to [place], [address] for your test.  If these instructions are not followed, your test will have to be rescheduled.  If you have questions or concerns about your appointment, you can call the Stress Lab at [phone].  If you cannot keep your appointment, please provide 24 hours notification to the Stress Lab, to avoid a possible $50 charge to your account

## 2019-04-17 ENCOUNTER — Other Ambulatory Visit (HOSPITAL_COMMUNITY)
Admission: RE | Admit: 2019-04-17 | Discharge: 2019-04-17 | Disposition: A | Discharge status: Home / Self Care | Payer: HRSA Program | Source: Ambulatory Visit | Attending: Cardiology | Admitting: Cardiology

## 2019-04-17 DIAGNOSIS — Z01812 Encounter for preprocedural laboratory examination: Secondary | ICD-10-CM | POA: Diagnosis present

## 2019-04-17 DIAGNOSIS — Z20822 Contact with and (suspected) exposure to covid-19: Secondary | ICD-10-CM | POA: Diagnosis not present

## 2019-04-17 LAB — SARS CORONAVIRUS 2 (TAT 6-24 HRS): SARS Coronavirus 2: NEGATIVE

## 2019-04-18 ENCOUNTER — Telehealth (HOSPITAL_COMMUNITY): Payer: Self-pay

## 2019-04-18 NOTE — Telephone Encounter (Signed)
Encounter complete. 

## 2019-04-19 ENCOUNTER — Telehealth (HOSPITAL_COMMUNITY): Payer: Self-pay | Admitting: *Deleted

## 2019-04-19 NOTE — Telephone Encounter (Signed)
Spoke to patient

## 2019-04-20 ENCOUNTER — Ambulatory Visit (HOSPITAL_COMMUNITY)
Admission: RE | Admit: 2019-04-20 | Discharge: 2019-04-20 | Disposition: A | Discharge status: Home / Self Care | Payer: Self-pay | Source: Ambulatory Visit | Attending: Cardiology | Admitting: Cardiology

## 2019-04-20 ENCOUNTER — Other Ambulatory Visit: Payer: Self-pay

## 2019-04-20 ENCOUNTER — Encounter (HOSPITAL_COMMUNITY): Payer: Self-pay | Admitting: *Deleted

## 2019-04-20 DIAGNOSIS — R079 Chest pain, unspecified: Secondary | ICD-10-CM | POA: Insufficient documentation

## 2019-04-20 LAB — EXERCISE TOLERANCE TEST
Estimated workload: 11.2 METS
Exercise duration (min): 9 min
Exercise duration (sec): 40 s
MPHR: 174 {beats}/min
Peak HR: 164 {beats}/min
Percent HR: 94 %
Rest HR: 91 {beats}/min

## 2019-04-20 NOTE — Progress Notes (Unsigned)
Abnormal ETT was reviewed by Dr. Martinique. Patient was released to go home.

## 2019-05-02 ENCOUNTER — Ambulatory Visit (INDEPENDENT_AMBULATORY_CARE_PROVIDER_SITE_OTHER): Payer: Self-pay | Admitting: Cardiology

## 2019-05-02 ENCOUNTER — Encounter: Payer: Self-pay | Admitting: Cardiology

## 2019-05-02 ENCOUNTER — Other Ambulatory Visit: Payer: Self-pay

## 2019-05-02 VITALS — BP 117/74 | HR 69 | Temp 96.6°F | Ht 71.0 in | Wt 263.4 lb

## 2019-05-02 DIAGNOSIS — Z8673 Personal history of transient ischemic attack (TIA), and cerebral infarction without residual deficits: Secondary | ICD-10-CM

## 2019-05-02 DIAGNOSIS — R9439 Abnormal result of other cardiovascular function study: Secondary | ICD-10-CM

## 2019-05-02 DIAGNOSIS — I10 Essential (primary) hypertension: Secondary | ICD-10-CM

## 2019-05-02 DIAGNOSIS — Z712 Person consulting for explanation of examination or test findings: Secondary | ICD-10-CM

## 2019-05-02 DIAGNOSIS — R072 Precordial pain: Secondary | ICD-10-CM

## 2019-05-02 DIAGNOSIS — E782 Mixed hyperlipidemia: Secondary | ICD-10-CM

## 2019-05-02 DIAGNOSIS — Z716 Tobacco abuse counseling: Secondary | ICD-10-CM

## 2019-05-02 DIAGNOSIS — Z01812 Encounter for preprocedural laboratory examination: Secondary | ICD-10-CM

## 2019-05-02 MED ORDER — METOPROLOL TARTRATE 25 MG PO TABS: ORAL_TABLET | ORAL | 0 refills | Status: DC

## 2019-05-02 NOTE — Patient Instructions (Signed)
Medication Instructions:  Your Physician recommend you continue on your current medication as directed.    *If you need a refill on your cardiac medications before your next appointment, please call your pharmacy*   Lab Work: Your physician recommends that you return for lab work 1 week prior to test (BMP).  If you have labs (blood work) drawn today and your tests are completely normal, you will receive your results only by: Marland Kitchen MyChart Message (if you have MyChart) OR . A paper copy in the mail If you have any lab test that is abnormal or we need to change your treatment, we will call you to review the results.   Testing/Procedures: Cardiac CT Angiography (CTA), is a special type of CT scan that uses a computer to produce multi-dimensional views of major blood vessels throughout the body. In CT angiography, a contrast material is injected through an IV to help visualize the blood vessels [facility]   Follow-Up: At [facility], you and your health needs are our priority.  As part of our continuing mission to provide you with exceptional heart care, we have created designated Provider Care Teams.  These Care Teams include your primary Cardiologist (physician) and Advanced Practice Providers (APPs -  Physician Assistants and Nurse Practitioners) who all work together to provide you with the care you need, when you need it.  We recommend signing up for the patient portal called "MyChart".  Sign up information is provided on this After Visit Summary.  MyChart is used to connect with patients for Virtual Visits (Telemedicine).  Patients are able to view lab/test results, encounter notes, upcoming appointments, etc.  Non-urgent messages can be sent to your provider as well.   To learn more about what you can do with MyChart, go to [email or web address].    Your next appointment:   3 month(s)  The format for your next appointment:   In Person  Provider:   Buford Dresser,  MD  Your cardiac CT will be scheduled at one of the below locations:   [facility] [address] [place],  [address] [phone]  If scheduled at [facility], please arrive at the [facility] main entrance of [facility] 30 minutes prior to test start time. Proceed to the [facility] Radiology Department (first floor) to check-in and test prep.  If scheduled at [facility], please arrive 15 mins early for check-in and test prep.  Please follow these instructions carefully (unless otherwise directed):  Hold all erectile dysfunction medications at least 3 days (72 hrs) prior to test.  On the Night Before the Test: . Be sure to Drink plenty of water. . Do not consume any caffeinated/decaffeinated beverages or chocolate 12 hours prior to your test. . Do not take any antihistamines 12 hours prior to your test.  On the Day of the Test: . Drink plenty of water. Do not drink any water within one hour of the test. . Do not eat any food 4 hours prior to the test. . You may take your regular medications prior to the test.  . Take metoprolol 25 mg (Lopressor) two hours prior to test. . HOLD Lisinopril-Hydrochlorothiazide morning of the test.       After the Test: . Drink plenty of water. . After receiving IV contrast, you may experience a mild flushed feeling. This is normal. . On occasion, you may experience a mild rash up to 24 hours after the test. This is  not dangerous. If this occurs, you can take Benadryl 25 mg and increase your fluid intake. . If you experience trouble breathing, this can be serious. If it is severe call 911 IMMEDIATELY. If it is mild, please call our office. . If you take any of these medications: Glipizide/Metformin, Avandament, Glucavance, please do not take 48 hours after completing test unless otherwise instructed.   Once we have confirmed authorization from your insurance company, we will call you  to set up a date and time for your test.   For non-scheduling related questions, please contact the cardiac imaging nurse navigator should you have any questions/concerns: Marchia Bond, RN Navigator Cardiac Imaging [facility] [phone] mobile

## 2019-05-02 NOTE — Progress Notes (Signed)
Cardiology Office Note:    Date:  05/02/2019   ID:  Aaron Crane, DOB 1971-02-08, MRN [id number]  PCP:  Caswell Corwin, [place] Cardiologist:  Buford Dresser, MD PhD  CC: follow up abnormal stress test  History of Present Illness:    Aaron Crane is a 49 y.o. male with a hx of hypertension, hyperlipidemia, sleep apnea using CPAP, history of stroke, tobacco abuse who is seen in follow up. He was initially seen 04/25/2018.  Cardiac history: Reported stroke in 2015, but no records available (PCP requesting from [place]), unclear etiology. Reports it as [facility] in [place] ([place], not able to find records). Reports that at the time of his stroke, was in a time of high stress. Was not eating well, had high blood pressure. Does not think it was a hemorrhagic stroke.  Has had hypertension since 2009, initially was high but improved with therapy. Has been well controlled since then.   Today: Reviewed treadmill stress test results. Abnormal ST depression with exercise. We reviewed the need for additional testing for further evaluation. See below, will plan to proceed with cardiac CT.  No additional chest pain since his initial episode. We did review that there were both typical and atypical symptoms with his prior episode, and with a history of CVA he is at considerable risk.  Cutting back on smoking. Down from 1 ppd to about 1 pack/week. Working on decreasing stress level at work. Cutting back on red meat, eating more fruit.  Denies chest pain, shortness of breath at rest or with normal exertion. No PND, orthopnea, LE edema or unexpected weight gain. No syncope or palpitations.  Past Medical History:  Diagnosis Date  . Headache   . Hypercholesteremia   . Hypertension   . Stroke [facility])     History reviewed. No pertinent surgical history.  Current Medications: Current Outpatient Medications on File Prior to Visit  Medication Sig  . aspirin EC  81 MG tablet Take 81 mg by mouth daily.  Marland Kitchen atorvastatin (LIPITOR) 40 MG tablet TAKE 2 TABLETS BY MOUTH IN THE EVENING  . lisinopril-hydrochlorothiazide (PRINZIDE,ZESTORETIC) 20-25 MG tablet lisinopril 20 mg-hydrochlorothiazide 25 mg tablet  TAKE 1 TABLET BY MOUTH ONCE DAILY IN THE MORNING   No current facility-administered medications on file prior to visit.     Allergies:   Patient has no known allergies.   Social History   Tobacco Use  . Smoking status: Current Every Day Smoker    Packs/day: 0.50    Years: 0.00    Pack years: 0.00    Types: Cigarettes  . Smokeless tobacco: Never Used  Substance Use Topics  . Alcohol use: Yes    Comment: occasional  . Drug use: No    Family History: Lot of hypertension, some cholesterol. No MI in his family, several family members with strokes. Doesn't know father's history, mother has hypertension. 2 sisters, one sister with hypertension, obesity, the other is healthy.  ROS:   Please see the history of present illness.  Additional pertinent ROS otherwise negative.  EKGs/Labs/Other Studies Reviewed:    The following studies were reviewed today:  04/20/19 ETT Blood pressure demonstrated a hypertensive response to exercise.  Downsloping ST segment depression ST segment depression of 2 mm was noted during stress in the II, III and aVF leads.   Abnormal ECG stress test due to the development of chest discomfort and ST segment changes.  Baseline ECG shows LVH with 1 mm downsloping ST segment depression.  Additional 1 mm ST depression occurs during exercise. Further evaluation for coronary insufficiency is probably warranted. However, the findings have reduced specificity due to the presence of baseline ST segment abnormalities.  EKG:  EKG is personally reviewed.  The ekg ordered 04/09/19 demonstrates normal sinus rhythm, nonspecific TW changes  Recent Labs: No results found for requested labs within last 8760 hours.  Recent Lipid Panel No  results found for: CHOL, TRIG, HDL, CHOLHDL, VLDL, LDLCALC, LDLDIRECT  From PCP notes, 03/20/2018: Normal CBC, normal CMP, normal TSH Lipids Tchol 231, TG 232, HDL 48, LDL 137 (on atorvastatin 40 mg)   Physical Exam:    VS:  BP 117/74   Pulse 69   Temp (!) 96.6 F (35.9 C)   Ht 5\' 11"  (1.803 m)   Wt 263 lb 6.4 oz (119.5 kg)   SpO2 97%   BMI 36.74 kg/m     Wt Readings from Last 3 Encounters:  05/02/19 263 lb 6.4 oz (119.5 kg)  04/09/19 263 lb 9.6 oz (119.6 kg)  04/25/18 256 lb 12.8 oz (116.5 kg)    GEN: Well nourished, well developed in no acute distress HEENT: Normal, moist mucous membranes NECK: No JVD CARDIAC: regular rhythm, normal S1 and S2, no rubs or gallops. No murmur. VASCULAR: Radial and DP pulses 2+ bilaterally. No carotid bruits RESPIRATORY:  Clear to auscultation without rales, wheezing or rhonchi  ABDOMEN: Soft, non-tender, non-distended MUSCULOSKELETAL:  Ambulates independently SKIN: Warm and dry, no edema NEUROLOGIC:  Alert and oriented x 3. No focal neuro deficits noted. PSYCHIATRIC:  Normal affect   ASSESSMENT:    1. Abnormal stress test   2. Precordial pain   3. Pre-procedure lab exam   4. Tobacco abuse counseling   5. History of stroke   6. Essential hypertension   7. Mixed hyperlipidemia   8. Encounter to discuss test results    PLAN:    Abnormal stress test:  -doesn't have insurance, but sent in financial assistance paperwork yesterday -with significant risk factors, prior chest pain, and abnormal stress test, he needs an anatomic study -We reviewed cardiac CT. Discussed risk/benefits. He is amenable -will order 25 mg metoprolol tartrate prior to test, HR 69 today -confirmed he is no longer taking sildenafil. Reviewed contraindication to this and nitroglycerin. He understands. Ok for SL NG for test -check BMET prior to test  Hypertension: at goal today -continue lisinopril-HCTZ 20-25mg  daily (hold AM of CT scan)  History of stroke, mixed  hyperlipidemia: on secondary prevention -on aspirin 81 mg daily -on atorvastatin 80 mg -recheck lipids when he has BMET done  Tobacco abuse: The patient was counseled on tobacco cessation today for 3 minutes.  Counseling included reviewing the risks of smoking tobacco products, how it impacts the patient's current medical diagnoses and different strategies for quitting.  Pharmacotherapy to aid in tobacco cessation was not  prescribed today.  Obstructive sleep apnea: using CPAP  Secondary prevention: -recommend heart healthy/Mediterranean diet, with whole grains, fruits, vegetable, fish, lean meats, nuts, and olive oil. Limit salt. -recommend moderate walking, 3-5 times/week for 30-50 minutes each session. Aim for at least 150 minutes.week. Goal should be pace of 3 miles/hours, or walking 1.5 miles in 30 minutes -recommend avoidance of tobacco products. Avoid excess alcohol.  Plan for follow up: scheduled appt 06/2019  Medication Adjustments/Labs and Tests Ordered: Current medicines are reviewed at length with the patient today.  Concerns regarding medicines are outlined above.  Orders Placed This Encounter  Procedures  . CT CORONARY  MORPH W/CTA COR W/SCORE W/CA W/CM &/OR WO/CM  . CT CORONARY FRACTIONAL FLOW RESERVE DATA PREP  . CT CORONARY FRACTIONAL FLOW RESERVE FLUID ANALYSIS  . Basic metabolic panel  . Lipid Profile   Meds ordered this encounter  Medications  . metoprolol tartrate (LOPRESSOR) 25 MG tablet    Sig: TAKE 1 TABLET 2 HR PRIOR TO CARDIAC PROCEDURE    Dispense:  1 tablet    Refill:  0    Patient Instructions  Medication Instructions:  Your Physician recommend you continue on your current medication as directed.    *If you need a refill on your cardiac medications before your next appointment, please call your pharmacy*   Lab Work: Your physician recommends that you return for lab work 1 week prior to test (BMP).  If you have labs (blood work) drawn today and  your tests are completely normal, you will receive your results only by: Marland Kitchen MyChart Message (if you have MyChart) OR . A paper copy in the mail If you have any lab test that is abnormal or we need to change your treatment, we will call you to review the results.   Testing/Procedures: Cardiac CT Angiography (CTA), is a special type of CT scan that uses a computer to produce multi-dimensional views of major blood vessels throughout the body. In CT angiography, a contrast material is injected through an IV to help visualize the blood vessels [facility]   Follow-Up: At [facility], you and your health needs are our priority.  As part of our continuing mission to provide you with exceptional heart care, we have created designated Provider Care Teams.  These Care Teams include your primary Cardiologist (physician) and Advanced Practice Providers (APPs -  Physician Assistants and Nurse Practitioners) who all work together to provide you with the care you need, when you need it.  We recommend signing up for the patient portal called "MyChart".  Sign up information is provided on this After Visit Summary.  MyChart is used to connect with patients for Virtual Visits (Telemedicine).  Patients are able to view lab/test results, encounter notes, upcoming appointments, etc.  Non-urgent messages can be sent to your provider as well.   To learn more about what you can do with MyChart, go to [email or web address].    Your next appointment:   3 month(s)  The format for your next appointment:   In Person  Provider:   Buford Dresser, MD  Your cardiac CT will be scheduled at one of the below locations:   [facility] [address] [place], [place] [address] [phone]  If scheduled at [facility], please arrive at the [facility] main entrance of [facility] 30 minutes prior to test start time. Proceed to the [facility] Radiology Department (first  floor) to check-in and test prep.  If scheduled at [facility], please arrive 15 mins early for check-in and test prep.  Please follow these instructions carefully (unless otherwise directed):  Hold all erectile dysfunction medications at least 3 days (72 hrs) prior to test.  On the Night Before the Test: . Be sure to Drink plenty of water. . Do not consume any caffeinated/decaffeinated beverages or chocolate 12 hours prior to your test. . Do not take any antihistamines 12 hours prior to your test.  On the Day of the Test: . Drink plenty of water. Do not drink any water within one hour of the test. . Do not eat any  food 4 hours prior to the test. . You may take your regular medications prior to the test.  . Take metoprolol 25 mg (Lopressor) two hours prior to test. . HOLD Lisinopril-Hydrochlorothiazide morning of the test.       After the Test: . Drink plenty of water. . After receiving IV contrast, you may experience a mild flushed feeling. This is normal. . On occasion, you may experience a mild rash up to 24 hours after the test. This is not dangerous. If this occurs, you can take Benadryl 25 mg and increase your fluid intake. . If you experience trouble breathing, this can be serious. If it is severe call 911 IMMEDIATELY. If it is mild, please call our office. . If you take any of these medications: Glipizide/Metformin, Avandament, Glucavance, please do not take 48 hours after completing test unless otherwise instructed.   Once we have confirmed authorization from your insurance company, we will call you to set up a date and time for your test.   For non-scheduling related questions, please contact the cardiac imaging nurse navigator should you have any questions/concerns: Marchia Bond, RN Navigator Cardiac Imaging [facility] [phone] mobile       Signed, Buford Dresser, MD PhD 05/02/2019 12:52 PM    [place]

## 2019-05-03 ENCOUNTER — Telehealth: Payer: Self-pay

## 2019-05-03 NOTE — Telephone Encounter (Signed)
Pt updated and verbalized understanding.  

## 2019-05-03 NOTE — Telephone Encounter (Signed)
Called pt to inform once [employer] gets his application back and they process him ,if he is approved for the [facility] card it will cover the Cardiac Ct. He can also apply through the county health department for their finance assistance, that would also cover the Cardiac Ct.    Left message to call back

## 2019-07-09 ENCOUNTER — Encounter: Payer: Self-pay | Admitting: Cardiology

## 2019-07-09 ENCOUNTER — Other Ambulatory Visit: Payer: Self-pay

## 2019-07-09 ENCOUNTER — Ambulatory Visit (INDEPENDENT_AMBULATORY_CARE_PROVIDER_SITE_OTHER): Payer: Self-pay | Admitting: Cardiology

## 2019-07-09 VITALS — BP 134/82 | HR 88 | Temp 96.6°F | Ht 71.0 in | Wt 267.4 lb

## 2019-07-09 DIAGNOSIS — E782 Mixed hyperlipidemia: Secondary | ICD-10-CM

## 2019-07-09 DIAGNOSIS — Z8673 Personal history of transient ischemic attack (TIA), and cerebral infarction without residual deficits: Secondary | ICD-10-CM

## 2019-07-09 DIAGNOSIS — Z716 Tobacco abuse counseling: Secondary | ICD-10-CM

## 2019-07-09 DIAGNOSIS — R9439 Abnormal result of other cardiovascular function study: Secondary | ICD-10-CM

## 2019-07-09 NOTE — Patient Instructions (Signed)

## 2019-07-09 NOTE — Progress Notes (Signed)
Cardiology Office Note:    Date:  07/09/2019   ID:  Aaron Crane, DOB [birth year], MRN [id number]  PCP:  Caswell Corwin, [place] Cardiologist:  Buford Dresser, MD PhD  CC: follow up  History of Present Illness:    Aaron Crane is a 49 y.o. male with a hx of hypertension, hyperlipidemia, sleep apnea using CPAP, history of stroke, tobacco abuse who is seen in follow up. He was initially seen 04/25/2018.  Cardiac history: Reported stroke in 2015, but no records available (PCP requesting from [place]), unclear etiology. Reports it as [facility] in [place] ([place], not able to find records). Reports that at the time of his stroke, was in a time of high stress. Was not eating well, had high blood pressure. Does not think it was a hemorrhagic stroke.  Has had hypertension since 2009, initially was high but improved with therapy. Has been well controlled since then.   Today: Had planned for cardiac CT due to abnormal stress test, however this has not been done. He says he was waiting on [facility] financial to get it approved. He filled out the form but hasn't called back. He has picked up another form today and will resend to make sure it has been received.  No chest pain. Trying to watch what eats, but has gained a little weight. Stays active. Using CPAP machine more regularly.  Down to 1 pack/week of cigarettes, hopes to be fully quit in another two weeks.   Denies chest pain, shortness of breath at rest or with normal exertion. No PND, orthopnea, LE edema or unexpected weight gain. No syncope or palpitations.  Past Medical History:  Diagnosis Date  . Headache   . Hypercholesteremia   . Hypertension   . Stroke [facility])     No past surgical history on file.  Current Medications: Current Outpatient Medications on File Prior to Visit  Medication Sig  . aspirin EC 81 MG tablet Take 81 mg by mouth daily.  Marland Kitchen atorvastatin (LIPITOR) 40 MG tablet TAKE 2  TABLETS BY MOUTH IN THE EVENING  . lisinopril-hydrochlorothiazide (PRINZIDE,ZESTORETIC) 20-25 MG tablet lisinopril 20 mg-hydrochlorothiazide 25 mg tablet  TAKE 1 TABLET BY MOUTH ONCE DAILY IN THE MORNING  . metoprolol tartrate (LOPRESSOR) 25 MG tablet TAKE 1 TABLET 2 HR PRIOR TO CARDIAC PROCEDURE   No current facility-administered medications on file prior to visit.     Allergies:   Patient has no known allergies.   Social History   Tobacco Use  . Smoking status: Current Every Day Smoker    Packs/day: 0.50    Years: 0.00    Pack years: 0.00    Types: Cigarettes  . Smokeless tobacco: Never Used  Substance Use Topics  . Alcohol use: Yes    Comment: occasional  . Drug use: No    Family History: Lot of hypertension, some cholesterol. No MI in his family, several family members with strokes. Doesn't know father's history, mother has hypertension. 2 sisters, one sister with hypertension, obesity, the other is healthy.  ROS:   Please see the history of present illness.  Additional pertinent ROS otherwise negative.  EKGs/Labs/Other Studies Reviewed:    The following studies were reviewed today:  04/20/19 ETT Blood pressure demonstrated a hypertensive response to exercise.  Downsloping ST segment depression ST segment depression of 2 mm was noted during stress in the II, III and aVF leads.   Abnormal ECG stress test due to the development of chest discomfort and  ST segment changes.  Baseline ECG shows LVH with 1 mm downsloping ST segment depression. Additional 1 mm ST depression occurs during exercise. Further evaluation for coronary insufficiency is probably warranted. However, the findings have reduced specificity due to the presence of baseline ST segment abnormalities.  EKG:  EKG is personally reviewed.  The ekg ordered 04/09/19 demonstrates normal sinus rhythm, nonspecific TW changes  Recent Labs: No results found for requested labs within last 8760 hours.  Recent Lipid  Panel No results found for: CHOL, TRIG, HDL, CHOLHDL, VLDL, LDLCALC, LDLDIRECT  From PCP notes, 03/20/2018: Normal CBC, normal CMP, normal TSH Lipids Tchol 231, TG 232, HDL 48, LDL 137 (on atorvastatin 40 mg)   Physical Exam:    VS:  BP 134/82   Pulse 88   Temp (!) 96.6 F (35.9 C)   Ht 5\' 11"  (1.803 m)   Wt 267 lb 6.4 oz (121.3 kg)   SpO2 95%   BMI 37.29 kg/m     Wt Readings from Last 3 Encounters:  07/09/19 267 lb 6.4 oz (121.3 kg)  05/02/19 263 lb 6.4 oz (119.5 kg)  04/09/19 263 lb 9.6 oz (119.6 kg)    GEN: Well nourished, well developed in no acute distress HEENT: Normal, moist mucous membranes NECK: No JVD CARDIAC: regular rhythm, normal S1 and S2, no rubs or gallops. No murmur. VASCULAR: Radial and DP pulses 2+ bilaterally. No carotid bruits RESPIRATORY:  Clear to auscultation without rales, wheezing or rhonchi  ABDOMEN: Soft, non-tender, non-distended MUSCULOSKELETAL:  Ambulates independently SKIN: Warm and dry, no edema NEUROLOGIC:  Alert and oriented x 3. No focal neuro deficits noted. PSYCHIATRIC:  Normal affect   ASSESSMENT:    1. Abnormal stress test   2. Tobacco abuse counseling   3. Mixed hyperlipidemia   4. History of stroke    PLAN:    Abnormal stress test:  -doesn't have insurance. Has been working with [facility] to see if he can get assistance for CT coverage. He will let us know if he needs assistance with this. -with significant risk factors, prior chest pain, and abnormal stress test, he needs an anatomic study. CT coronary preferred over cath. Has prior metoprolol prescription -confirmed he is no longer taking sildenafil. Reviewed contraindication to this and nitroglycerin. He understands. Ok for SL NG for test -BMET prior to test -instructed on red flag warning signs that need immediate medical attention  Hypertension: near goal today -continue lisinopril-HCTZ 20-25mg  daily  History of stroke, mixed hyperlipidemia: on secondary prevention -on  aspirin 81 mg daily -on atorvastatin 80 mg -recheck lipids when he has BMET done  Tobacco abuse: The patient was counseled on tobacco cessation today for 4 minutes.  Counseling included reviewing the risks of smoking tobacco products, how it impacts the patient's current medical diagnoses and different strategies for quitting.  Pharmacotherapy to aid in tobacco cessation was not prescribed today. Obstructive sleep apnea: using CPAP  Secondary prevention: -recommend heart healthy/Mediterranean diet, with whole grains, fruits, vegetable, fish, lean meats, nuts, and olive oil. Limit salt. -recommend moderate walking, 3-5 times/week for 30-50 minutes each session. Aim for at least 150 minutes.week. Goal should be pace of 3 miles/hours, or walking 1.5 miles in 30 minutes -recommend avoidance of tobacco products. Avoid excess alcohol.  Plan for follow up: 6 mos or sooner based on test results  Medication Adjustments/Labs and Tests Ordered: Current medicines are reviewed at length with the patient today.  Concerns regarding medicines are outlined above.  No orders of the  defined types were placed in this encounter.  No orders of the defined types were placed in this encounter.   Patient Instructions  Medication Instructions:  Your Physician recommend you continue on your current medication as directed.    *If you need a refill on your cardiac medications before your next appointment, please call your pharmacy*   Lab Work: None    Testing/Procedures: None   Follow-Up: At [facility], you and your health needs are our priority.  As part of our continuing mission to provide you with exceptional heart care, we have created designated Provider Care Teams.  These Care Teams include your primary Cardiologist (physician) and Advanced Practice Providers (APPs -  Physician Assistants and Nurse Practitioners) who all work together to provide you with the care you need, when you need it.  We  recommend signing up for the patient portal called "MyChart".  Sign up information is provided on this After Visit Summary.  MyChart is used to connect with patients for Virtual Visits (Telemedicine).  Patients are able to view lab/test results, encounter notes, upcoming appointments, etc.  Non-urgent messages can be sent to your provider as well.   To learn more about what you can do with MyChart, go to [email or web address].    Your next appointment:   6 month(s)  The format for your next appointment:   In Person  Provider:   Buford Dresser, MD       Signed, Buford Dresser, MD PhD 07/09/2019 8:10 AM    [place]

## 2019-08-16 ENCOUNTER — Other Ambulatory Visit: Payer: Self-pay

## 2019-08-20 ENCOUNTER — Ambulatory Visit: Payer: HRSA Program | Attending: Internal Medicine

## 2019-08-20 DIAGNOSIS — Z20822 Contact with and (suspected) exposure to covid-19: Secondary | ICD-10-CM | POA: Insufficient documentation

## 2019-08-21 LAB — NOVEL CORONAVIRUS, NAA: SARS-CoV-2, NAA: NOT DETECTED

## 2019-08-21 LAB — SARS-COV-2, NAA 2 DAY TAT

## 2019-08-22 ENCOUNTER — Encounter: Payer: Self-pay | Admitting: Cardiology

## 2019-11-27 ENCOUNTER — Telehealth: Payer: Self-pay | Admitting: *Deleted

## 2019-11-27 NOTE — Telephone Encounter (Signed)
A message was left, re: his follow up visit. 

## 2019-11-28 ENCOUNTER — Telehealth: Payer: Self-pay | Admitting: Cardiology

## 2019-11-28 NOTE — Telephone Encounter (Signed)
lvm for patient to return call to get follow up visit scheduled with Harrell Gave from recall list

## 2020-01-18 ENCOUNTER — Encounter: Payer: Self-pay | Admitting: Cardiology

## 2020-03-14 ENCOUNTER — Other Ambulatory Visit: Payer: Self-pay

## 2020-03-14 DIAGNOSIS — Z20822 Contact with and (suspected) exposure to covid-19: Secondary | ICD-10-CM

## 2020-03-16 LAB — SARS-COV-2, NAA 2 DAY TAT

## 2020-03-16 LAB — NOVEL CORONAVIRUS, NAA: SARS-CoV-2, NAA: DETECTED — AB

## 2020-03-17 ENCOUNTER — Other Ambulatory Visit: Payer: Self-pay | Admitting: Adult Health

## 2020-03-17 ENCOUNTER — Telehealth: Payer: Self-pay | Admitting: Adult Health

## 2020-03-17 DIAGNOSIS — U071 COVID-19: Secondary | ICD-10-CM

## 2020-03-17 NOTE — Telephone Encounter (Signed)
Called to discuss with patient about COVID-19 symptoms and the use of one of the available treatments for those with mild to moderate Covid symptoms and at a high risk of hospitalization.  Pt appears to qualify for outpatient treatment due to co-morbid conditions and/or a member of an at-risk group in accordance with the FDA Emergency Use Authorization.    Unable to reach pt - [place], MyChart Message sent   Scot Dock

## 2020-03-17 NOTE — Progress Notes (Signed)
I connected by phone with Aaron Crane on 03/17/2020 at 2:24 PM to discuss the potential use of a new treatment for mild to moderate COVID-19 viral infection in non-hospitalized patients.  This patient is a 50 y.o. male that meets the FDA criteria for Emergency Use Authorization of COVID monoclonal antibody sotrovimab.  Has a (+) direct SARS-CoV-2 viral test result  Has mild or moderate COVID-19   Is NOT hospitalized due to COVID-19  Is within 10 days of symptom onset  Has at least one of the high risk factor(s) for progression to severe COVID-19 and/or hospitalization as defined in EUA.  Specific high risk criteria : BMI > 25 and Cardiovascular disease or hypertension   I have spoken and communicated the following to the patient or parent/caregiver regarding COVID monoclonal antibody treatment:  1. FDA has authorized the emergency use for the treatment of mild to moderate COVID-19 in adults and pediatric patients with positive results of direct SARS-CoV-2 viral testing who are 20 years of age and older weighing at least 40 kg, and who are at high risk for progressing to severe COVID-19 and/or hospitalization.  2. The significant known and potential risks and benefits of COVID monoclonal antibody, and the extent to which such potential risks and benefits are unknown.  3. Information on available alternative treatments and the risks and benefits of those alternatives, including clinical trials.  4. Patients treated with COVID monoclonal antibody should continue to self-isolate and use infection control measures (e.g., wear mask, isolate, social distance, avoid sharing personal items, clean and disinfect "high touch" surfaces, and frequent handwashing) according to CDC guidelines.   5. The patient or parent/caregiver has the option to accept or refuse COVID monoclonal antibody treatment.  After reviewing this information with the patient, the patient has agreed to receive one of the  available covid 19 monoclonal antibodies and will be provided an appropriate fact sheet prior to infusion. Scot Dock, NP 03/17/2020 2:24 PM

## 2020-03-18 ENCOUNTER — Ambulatory Visit (HOSPITAL_COMMUNITY)
Admission: RE | Admit: 2020-03-18 | Discharge: 2020-03-18 | Disposition: A | Discharge status: Home / Self Care | Payer: HRSA Program | Source: Ambulatory Visit | Attending: Pulmonary Disease | Admitting: Pulmonary Disease

## 2020-03-18 DIAGNOSIS — U071 COVID-19: Secondary | ICD-10-CM | POA: Diagnosis not present

## 2020-03-18 MED ORDER — ALBUTEROL SULFATE HFA 108 (90 BASE) MCG/ACT IN AERS: 2.0000 | INHALATION_SPRAY | Freq: Once | RESPIRATORY_TRACT | Status: DC | PRN

## 2020-03-18 MED ORDER — METHYLPREDNISOLONE SODIUM SUCC 125 MG IJ SOLR: 125.0000 mg | Freq: Once | INTRAMUSCULAR | Status: DC | PRN

## 2020-03-18 MED ORDER — FAMOTIDINE IN NACL 20-0.9 MG/50ML-% IV SOLN: 20.0000 mg | Freq: Once | INTRAVENOUS | Status: DC | PRN

## 2020-03-18 MED ORDER — EPINEPHRINE 0.3 MG/0.3ML IJ SOAJ: 0.3000 mg | Freq: Once | INTRAMUSCULAR | Status: DC | PRN

## 2020-03-18 MED ORDER — DIPHENHYDRAMINE HCL 50 MG/ML IJ SOLN: 50.0000 mg | Freq: Once | INTRAMUSCULAR | Status: DC | PRN

## 2020-03-18 MED ORDER — SODIUM CHLORIDE 0.9 % IV SOLN: INTRAVENOUS | Status: DC | PRN

## 2020-03-18 MED ORDER — SOTROVIMAB 500 MG/8ML IV SOLN
500.0000 mg | Freq: Once | INTRAVENOUS | Status: AC
  Administered 2020-03-18: 500 mg via INTRAVENOUS

## 2020-03-18 NOTE — Discharge Instructions (Signed)

## 2020-03-18 NOTE — Progress Notes (Signed)
  Diagnosis: COVID-19  Physician: Dr Joya Gaskins  Procedure: [facility] Infusion [facility] Med: bamlanivimab\etesevimab infusion - Provided patient with bamlanimivab\etesevimab fact sheet for patients, parents and caregivers prior to infusion.  Complications: No immediate complications noted.  Discharge: Discharged home   Ashley Murrain 03/18/2020

## 2020-03-18 NOTE — Progress Notes (Signed)
Patient reviewed Fact Sheet for Patients, Parents, and Caregivers for Emergency Use Authorization (EUA) of sotrovimab for the Treatment of Coronavirus. Patient also reviewed and is agreeable to the estimated cost of treatment. Patient is agreeable to proceed.   

## 2021-07-15 ENCOUNTER — Ambulatory Visit
Admission: EM | Admit: 2021-07-15 | Discharge: 2021-07-15 | Disposition: A | Discharge status: Home / Self Care | Payer: Self-pay | Attending: Emergency Medicine | Admitting: Emergency Medicine

## 2021-07-15 ENCOUNTER — Encounter: Payer: Self-pay | Admitting: Emergency Medicine

## 2021-07-15 DIAGNOSIS — Z23 Encounter for immunization: Secondary | ICD-10-CM

## 2021-07-15 DIAGNOSIS — S51811A Laceration without foreign body of right forearm, initial encounter: Secondary | ICD-10-CM

## 2021-07-15 MED ORDER — IBUPROFEN 600 MG PO TABS: 600.0000 mg | ORAL_TABLET | Freq: Four times a day (QID) | ORAL | 0 refills | Status: DC | PRN

## 2021-07-15 MED ORDER — TETANUS-DIPHTH-ACELL PERTUSSIS 5-2.5-18.5 LF-MCG/0.5 IM SUSY
0.5000 mL | PREFILLED_SYRINGE | Freq: Once | INTRAMUSCULAR | Status: AC
  Administered 2021-07-15: 0.5 mL via INTRAMUSCULAR

## 2021-07-15 NOTE — ED Triage Notes (Signed)
Pt presents with a laceration to his right arm with fence wiring he was building a chicken coop.  ?

## 2021-07-15 NOTE — Discharge Instructions (Addendum)
Keep this clean and dry for the next 48 to 72 hours.  Take 600 mg of ibuprofen combined with 1000 mg of Tylenol together 3-4 times a day as needed for pain.  Return here sooner for any signs of infection. ?

## 2021-07-15 NOTE — ED Provider Notes (Signed)
HPI ? ?SUBJECTIVE: ? ?Aaron Crane is a right-handed 51 y.o. male who presents with an clean, gaping, laceration to his dorsal right forearm sustained on a clean piece of metal roof immediately prior to arrival.  He applied pressure and came here.  No aggravating or alleviating factors.  He has not tried thing for this.  No distal numbness, tingling, finger weakness, foreign body sensation.  Reports mild tenderness surrounding the laceration.  He has a past medical history of hypertension, hypercholesterolemia.  Last tetanus unknown. ? ? ? ?Past Medical History:  ?Diagnosis Date  ? Headache   ? Hypercholesteremia   ? Hypertension   ? Stroke [facility])   ? ? ?History reviewed. No pertinent surgical history. ? ?Family History  ?Problem Relation Age of Onset  ? Hypertension Sister   ? ? ?Social History  ? ?Tobacco Use  ? Smoking status: Every Day  ?  Packs/day: 0.50  ?  Years: 0.00  ?  Pack years: 0.00  ?  Types: Cigarettes  ? Smokeless tobacco: Never  ?Vaping Use  ? Vaping Use: Never used  ?Substance Use Topics  ? Alcohol use: Yes  ?  Comment: occasional  ? Drug use: No  ? ? ? ?Current Facility-Administered Medications:  ?  Tdap (BOOSTRIX) injection 0.5 mL, 0.5 mL, Intramuscular, Once, Melynda Ripple, MD ? ?Current Outpatient Medications:  ?  aspirin EC 81 MG tablet, Take 81 mg by mouth daily., Disp: , Rfl:  ?  atorvastatin (LIPITOR) 40 MG tablet, TAKE 2 TABLETS BY MOUTH IN THE EVENING, Disp: , Rfl:  ?  ibuprofen (ADVIL) 600 MG tablet, Take 1 tablet (600 mg total) by mouth every 6 (six) hours as needed., Disp: 30 tablet, Rfl: 0 ?  lisinopril-hydrochlorothiazide (PRINZIDE,ZESTORETIC) 20-25 MG tablet, lisinopril 20 mg-hydrochlorothiazide 25 mg tablet  TAKE 1 TABLET BY MOUTH ONCE DAILY IN THE MORNING, Disp: , Rfl:  ?  metoprolol tartrate (LOPRESSOR) 25 MG tablet, TAKE 1 TABLET 2 HR PRIOR TO CARDIAC PROCEDURE, Disp: 1 tablet, Rfl: 0 ? ?No Known Allergies ? ? ?ROS ? ?As noted in HPI.  ? ?Physical Exam ? ?BP (!) 143/94 (BP  Location: Left Arm)   Pulse 87   Temp 98 ?F (36.7 ?C) (Oral)   Resp 18   SpO2 97%  ? ?Constitutional: Well developed, well nourished, no acute distress ?Eyes:  EOMI, conjunctiva normal bilaterally ?HENT: Normocephalic, atraumatic,mucus membranes moist ?Respiratory: Normal inspiratory effort ?Cardiovascular: Normal rate ?GI: nondistended ?skin: 4 cm clean linear gaping wound dorsum right forearm ? ? ? ? ?Musculoskeletal: Sensation and strength intact distally in the median/radial/ulnar distribution.  Grip strength 5/5 and equal bilaterally.  Patient able to flex/extend all fingers against resistance. ?Neurologic: Alert & oriented x 3, no focal neuro deficits ?Psychiatric: Speech and behavior appropriate ? ? ?ED Course ? ? ?Medications  ?Tdap (BOOSTRIX) injection 0.5 mL (has no administration in time range)  ? ? ?No orders of the defined types were placed in this encounter. ? ? ?No results found for this or any previous visit (from the past 24 hour(s)). ?No results found. ? ?ED Clinical Impression ? ?1. Laceration of right forearm, initial encounter   ?  ? ?ED Assessment/Plan ? ?Procedure note: Cleaned area with chlorhexidine and water, alcohol.  Used 5 cc of lidocaine 1% with epinephrine via local infiltration with adequate anesthesia.  Then again copiously irrigated and scrubbed the wound out with chlorhexidine and tap water.  Explored wound with adequate hemostasis, no tendon or neurovascular involvement noted.  Placed  5 5-0 horizontal mattress Vicryl sutures to decrease tension on wound edges, then placed 9 staples with close approximation of wound edges.  Placed dressing.  Patient tolerated procedure well. ? ?Updating tetanus ? ?Do not think that we need to send him on prophylactic antibiotics as patient sustained this on a clean piece of metal and it was cleaned out thoroughly.  Home with Tylenol/ibuprofen.  Return here in 10 days for staple removal.  Sooner for any signs of infection. ? ?Discussed  MDM,  treatment plan, and plan for follow-up with patient. patient agrees with plan.  ? ?Meds ordered this encounter  ?Medications  ? Tdap (BOOSTRIX) injection 0.5 mL  ? ibuprofen (ADVIL) 600 MG tablet  ?  Sig: Take 1 tablet (600 mg total) by mouth every 6 (six) hours as needed.  ?  Dispense:  30 tablet  ?  Refill:  0  ? ? ? ? ?*This clinic note was created using [employer]obby[employer]. Therefore, there may be occasional mistakes despite careful proofreading. ? ?? ? ?  ?Melynda Ripple, MD ?07/15/21 1702 ? ?

## 2021-07-28 ENCOUNTER — Ambulatory Visit
Admission: EM | Admit: 2021-07-28 | Discharge: 2021-07-28 | Disposition: A | Discharge status: Home / Self Care | Payer: Self-pay

## 2021-07-28 DIAGNOSIS — Z4802 Encounter for removal of sutures: Secondary | ICD-10-CM

## 2021-07-28 DIAGNOSIS — S51811D Laceration without foreign body of right forearm, subsequent encounter: Secondary | ICD-10-CM

## 2021-07-28 NOTE — ED Triage Notes (Signed)
Patient in office today for staples removal. Patient was instructed to come back in 10 days unable to come. No signs or symptoms noted assessed via provider

## 2022-03-18 DIAGNOSIS — Z113 Encounter for screening for infections with a predominantly sexual mode of transmission: Secondary | ICD-10-CM | POA: Diagnosis not present

## 2022-03-18 DIAGNOSIS — R7309 Other abnormal glucose: Secondary | ICD-10-CM | POA: Diagnosis not present

## 2022-03-18 DIAGNOSIS — Z0000 Encounter for general adult medical examination without abnormal findings: Secondary | ICD-10-CM | POA: Diagnosis not present

## 2022-03-18 DIAGNOSIS — Z114 Encounter for screening for human immunodeficiency virus [HIV]: Secondary | ICD-10-CM | POA: Diagnosis not present

## 2022-03-18 DIAGNOSIS — E785 Hyperlipidemia, unspecified: Secondary | ICD-10-CM | POA: Diagnosis not present

## 2022-03-24 ENCOUNTER — Telehealth: Payer: Self-pay | Admitting: Pain Medicine

## 2022-03-24 NOTE — Telephone Encounter (Signed)
Received sleep referral from [place] NP for OSA eval. Placed in sleep referrals box

## 2022-03-26 ENCOUNTER — Encounter: Payer: Self-pay | Admitting: Gastroenterology

## 2022-04-06 ENCOUNTER — Ambulatory Visit (AMBULATORY_SURGERY_CENTER): Payer: Medicaid Other

## 2022-04-06 VITALS — Ht 71.0 in | Wt 280.0 lb

## 2022-04-06 DIAGNOSIS — Z1211 Encounter for screening for malignant neoplasm of colon: Secondary | ICD-10-CM

## 2022-04-06 MED ORDER — NA SULFATE-K SULFATE-MG SULF 17.5-3.13-1.6 GM/177ML PO SOLN
1.0000 | Freq: Once | ORAL | 0 refills | Status: AC
Start: 2022-04-06 — End: 2022-04-06

## 2022-04-06 NOTE — Progress Notes (Signed)
Pre visit completed via phone call; Patient verified name, DOB, and address;  No egg or soy allergy known to patient;  No issues known to pt with past sedation with any surgeries or procedures; Patient denies ever being told they had issues or difficulty with intubation;  No FH of Malignant Hyperthermia; Pt is not on diet pills; Pt is not on home 02;  Pt is not on blood thinners;  Pt denies issues with constipation  No A fib or A flutter; Have any cardiac testing pending--NO Pt instructed to use Singlecare.com or GoodRx for a price reduction on prep;   Insurance verified during [place] appt=Medicaid  Patient's chart reviewed by Osvaldo Angst CNRA prior to previsit and patient appropriate for the [facility].  Previsit completed and red dot placed by patient's name on their procedure day (on provider's schedule).    GoodRx coupon information sent in on Rx. For price reduction request;

## 2022-04-26 ENCOUNTER — Encounter: Payer: Self-pay | Admitting: Gastroenterology

## 2022-04-26 ENCOUNTER — Ambulatory Visit: Payer: Medicaid Other | Admitting: Gastroenterology

## 2022-04-26 VITALS — BP 114/77 | HR 70 | Temp 96.9°F | Resp 12 | Ht 71.0 in | Wt 280.0 lb

## 2022-04-26 DIAGNOSIS — D123 Benign neoplasm of transverse colon: Secondary | ICD-10-CM | POA: Diagnosis not present

## 2022-04-26 DIAGNOSIS — D122 Benign neoplasm of ascending colon: Secondary | ICD-10-CM

## 2022-04-26 DIAGNOSIS — D128 Benign neoplasm of rectum: Secondary | ICD-10-CM

## 2022-04-26 DIAGNOSIS — E785 Hyperlipidemia, unspecified: Secondary | ICD-10-CM | POA: Diagnosis not present

## 2022-04-26 DIAGNOSIS — Z1211 Encounter for screening for malignant neoplasm of colon: Secondary | ICD-10-CM

## 2022-04-26 DIAGNOSIS — K635 Polyp of colon: Secondary | ICD-10-CM | POA: Diagnosis not present

## 2022-04-26 DIAGNOSIS — G4733 Obstructive sleep apnea (adult) (pediatric): Secondary | ICD-10-CM | POA: Diagnosis not present

## 2022-04-26 DIAGNOSIS — I10 Essential (primary) hypertension: Secondary | ICD-10-CM | POA: Diagnosis not present

## 2022-04-26 DIAGNOSIS — D125 Benign neoplasm of sigmoid colon: Secondary | ICD-10-CM | POA: Diagnosis not present

## 2022-04-26 DIAGNOSIS — K621 Rectal polyp: Secondary | ICD-10-CM | POA: Diagnosis not present

## 2022-04-26 MED ORDER — SODIUM CHLORIDE 0.9 % IV SOLN
500.0000 mL | Freq: Once | INTRAVENOUS | Status: DC
Start: 2022-04-26 — End: 2022-04-26

## 2022-04-26 NOTE — Op Note (Signed)
[place] Patient Name: Mindy Verzosa Procedure Date: 04/26/2022 9:50 AM MRN: [id number] Endoscopist: Nicki Reaper E. Candis Schatz , MD, [id number] Age: 52 Referring MD:  Date of Birth: 1971-01-01 Gender: Male Account #: [id number] Procedure:                Colonoscopy Indications:              Screening for colorectal malignant neoplasm, This                            is the patient's first colonoscopy Medicines:                Monitored Anesthesia Care Procedure:                Pre-Anesthesia Assessment:                           - Prior to the procedure, a History and Physical                            was performed, and patient medications and                            allergies were reviewed. The patient's tolerance of                            previous anesthesia was also reviewed. The risks                            and benefits of the procedure and the sedation                            options and risks were discussed with the patient.                            All questions were answered, and informed consent                            was obtained. Prior Anticoagulants: The patient has                            taken no anticoagulant or antiplatelet agents. ASA                            Grade Assessment: II - A patient with mild systemic                            disease. After reviewing the risks and benefits,                            the patient was deemed in satisfactory condition to                            undergo the procedure.  After obtaining informed consent, the colonoscope                            was passed under direct vision. Throughout the                            procedure, the patient's blood pressure, pulse, and                            oxygen saturations were monitored continuously. The                            CF [id number] was introduced through the anus                            and advanced to  the the terminal ileum, with                            identification of the appendiceal orifice and IC                            valve. The colonoscopy was performed without                            difficulty. The patient tolerated the procedure                            well. The quality of the bowel preparation was                            adequate. The terminal ileum, ileocecal valve,                            appendiceal orifice, and rectum were photographed.                            The bowel preparation used was SUPREP via split                            dose instruction. Scope In: 9:57:07 AM Scope Out: 10:30:12 AM Scope Withdrawal Time: 0 hours 27 minutes 30 seconds  Total Procedure Duration: 0 hours 33 minutes 5 seconds  Findings:                 The perianal and digital rectal examinations were                            normal. Pertinent negatives include normal                            sphincter tone and no palpable rectal lesions.                           Three sessile polyps were found in the ascending  colon. The polyps were 2 to 5 mm in size. These                            polyps were removed with a cold snare. Resection                            and retrieval were complete. Estimated blood loss                            was minimal.                           Two sessile polyps were found in the hepatic                            flexure. The polyps were 2 to 8 mm in size. These                            polyps were removed with a cold snare. Resection                            and retrieval were complete. Estimated blood loss                            was minimal.                           A 15 mm polyp was found in the transverse colon.                            The polyp was semi-pedunculated. The polyp was                            removed with a hot snare. Resection and retrieval                            were  complete. Estimated blood loss: none.                           A 4 mm polyp was found in the splenic flexure. The                            polyp was sessile. The polyp was removed with a                            cold snare. Resection and retrieval were complete.                            Estimated blood loss was minimal.                           Many sessile polyps were found in the rectum and  sigmoid colon. The polyps were 2 to 8 mm in size.                            Two of these polyps were removed with a cold snare.                            Resection and retrieval were complete. Estimated                            blood loss was minimal.                           The exam was otherwise normal throughout the                            examined colon.                           The terminal ileum appeared normal.                           The retroflexed view of the distal rectum and anal                            verge was normal and showed no anal or rectal                            abnormalities. Complications:            No immediate complications. Estimated Blood Loss:     Estimated blood loss was minimal. Impression:               - Three 2 to 5 mm polyps in the ascending colon,                            removed with a cold snare. Resected and retrieved.                           - Two 2 to 8 mm polyps at the hepatic flexure,                            removed with a cold snare. Resected and retrieved.                           - One 15 mm polyp in the transverse colon, removed                            with a hot snare. Resected and retrieved.                           - One 4 mm polyp at the splenic flexure, removed                            with a cold  snare. Resected and retrieved.                           - Many 2 to 8 mm polyps in the rectum and in the                            sigmoid colon, two removed with a cold snare.                             Resected and retrieved. These were consistent with                            hyperplastic polyps                           - The examined portion of the ileum was normal.                           - The distal rectum and anal verge are normal on                            retroflexion view. Recommendation:           - Patient has a contact number available for                            emergencies. The signs and symptoms of potential                            delayed complications were discussed with the                            patient. Return to normal activities tomorrow.                            Written discharge instructions were provided to the                            patient.                           - Resume previous diet.                           - Continue present medications.                           - Await pathology results.                           - Repeat colonoscopy (date not yet determined) for                            surveillance based on pathology results. Kollyn Lingafelter E. Candis Schatz, MD 04/26/2022 10:38:57 AM This report has been signed electronically.

## 2022-04-26 NOTE — Progress Notes (Signed)
Report to PACU, RN, vss, BBS= Clear.  

## 2022-04-26 NOTE — Patient Instructions (Addendum)
Continue present medications. Await pathology results. Repeat colonoscopy (date not yet determined) for surveillance based on pathology results.  Please read over handout about polyps                            YOU HAD AN ENDOSCOPIC PROCEDURE TODAY AT [facility]:   Refer to the procedure report that was given to you for any specific questions about what was found during the examination.  If the procedure report does not answer your questions, please call your gastroenterologist to clarify.  If you requested that your care partner not be given the details of your procedure findings, then the procedure report has been included in a sealed envelope for you to review at your convenience later.  YOU SHOULD EXPECT: Some feelings of bloating in the abdomen. Passage of more gas than usual.  Walking can help get rid of the air that was put into your GI tract during the procedure and reduce the bloating. If you had a lower endoscopy (such as a colonoscopy or flexible sigmoidoscopy) you may notice spotting of blood in your stool or on the toilet paper. If you underwent a bowel prep for your procedure, you may not have a normal bowel movement for a few days.  Please Note:  You might notice some irritation and congestion in your nose or some drainage.  This is from the oxygen used during your procedure.  There is no need for concern and it should clear up in a day or so.  SYMPTOMS TO REPORT IMMEDIATELY:  Following lower endoscopy (colonoscopy or flexible sigmoidoscopy):  Excessive amounts of blood in the stool  Significant tenderness or worsening of abdominal pains  Swelling of the abdomen that is new, acute  Fever of 100F or higher  For urgent or emergent issues, a gastroenterologist can be reached at any hour by calling [phone]. Do not use MyChart messaging for urgent concerns.    DIET:  We do recommend a small meal at first, but then you may proceed to your regular diet.   Drink plenty of fluids but you should avoid alcoholic beverages for 24 hours.  ACTIVITY:  You should plan to take it easy for the rest of today and you should NOT DRIVE or use heavy machinery until tomorrow (because of the sedation medicines used during the test).    FOLLOW UP: Our staff will call the number listed on your records the next business day following your procedure.  We will call around 7:15- 8:00 am to check on you and address any questions or concerns that you may have regarding the information given to you following your procedure. If we do not reach you, we will leave a message.     If any biopsies were taken you will be contacted by phone or by letter within the next 1-3 weeks.  Please call us at [phone] if you have not heard about the biopsies in 3 weeks.    SIGNATURES/CONFIDENTIALITY: You and/or your care partner have signed paperwork which will be entered into your electronic medical record.  These signatures attest to the fact that that the information above on your After Visit Summary has been reviewed and is understood.  Full responsibility of the confidentiality of this discharge information lies with you and/or your care-partner.

## 2022-04-26 NOTE — Progress Notes (Signed)
Called to room to assist during endoscopic procedure.  Patient ID and intended procedure confirmed with present staff. Received instructions for my participation in the procedure from the performing physician.  

## 2022-04-26 NOTE — Progress Notes (Signed)
[facility] History and Physical   Primary Care Physician:  Pcp, No   Reason for Procedure:   Colon cancer screening  Plan:    Screening colonoscopy     HPI: Aaron Crane is a 52 y.o. male undergoing initial average risk screening colonoscopy.  He has no family history of colon cancer and no chronic GI symptoms.    Past Medical History:  Diagnosis Date   Headache    Hypercholesteremia    on meds   Hypertension    on meds   Sleep apnea    uses CPAP nightly   Stroke [facility]) 2014    Past Surgical History:  Procedure Laterality Date   NO PAST SURGERIES      Prior to Admission medications   Medication Sig Start Date End Date Taking? Authorizing Provider  aspirin EC 81 MG tablet Take 81 mg by mouth daily.   Yes [provider]  atorvastatin (LIPITOR) 40 MG tablet TAKE 2 TABLETS BY MOUTH IN THE EVENING 03/22/18  Yes [provider]  lisinopril-hydrochlorothiazide (PRINZIDE,ZESTORETIC) 20-25 MG tablet lisinopril 20 mg-hydrochlorothiazide 25 mg tablet  TAKE 1 TABLET BY MOUTH ONCE DAILY IN THE MORNING   Yes [provider]    Current Outpatient Medications  Medication Sig Dispense Refill   aspirin EC 81 MG tablet Take 81 mg by mouth daily.     atorvastatin (LIPITOR) 40 MG tablet TAKE 2 TABLETS BY MOUTH IN THE EVENING     lisinopril-hydrochlorothiazide (PRINZIDE,ZESTORETIC) 20-25 MG tablet lisinopril 20 mg-hydrochlorothiazide 25 mg tablet  TAKE 1 TABLET BY MOUTH ONCE DAILY IN THE MORNING     Current Facility-Administered Medications  Medication Dose Route Frequency Provider Last Rate Last Admin   0.9 %  sodium chloride infusion  500 mL Intravenous Once Daryel November, MD        Allergies as of 04/26/2022   (No Known Allergies)    Family History  Problem Relation Age of Onset   Hypertension Sister    Colon polyps Neg Hx    Colon cancer Neg Hx    Esophageal cancer Neg Hx    Rectal cancer Neg Hx    Stomach cancer Neg Hx      Social History   Socioeconomic History   Marital status: Married    Spouse name: Not on file   Number of children: 2   Years of education: Not on file   Highest education level: Not on file  Occupational History   Not on file  Tobacco Use   Smoking status: Every Day    Packs/day: 0.25    Years: 0.00    Total pack years: 0.00    Types: Cigarettes   Smokeless tobacco: Never  Vaping Use   Vaping Use: Never used  Substance and Sexual Activity   Alcohol use: Not Currently    Alcohol/week: 0.0 - 1.0 standard drinks of alcohol    Comment: occasional   Drug use: No   Sexual activity: Yes  Other Topics Concern   Not on file  Social History Narrative   Not on file   Social Determinants of Health   Financial Resource Strain: Not on file  Food Insecurity: Not on file  Transportation Needs: Not on file  Physical Activity: Insufficiently Active (04/25/2018)   Exercise Vital Sign    Days of Exercise per Week: 2 days    Minutes of Exercise per Session: 30 min  Stress: Not on file  Social Connections: Not on file  Intimate Partner  Violence: Not on file    Review of Systems:  All other review of systems negative except as mentioned in the HPI.  Physical Exam: Vital signs BP (!) 159/106   Pulse 89   Temp (!) 96.9 F (36.1 C)   Ht '5\' 11"'$  (1.803 m)   Wt 280 lb (127 kg)   SpO2 100%   BMI 39.05 kg/m   General:   Alert,  Well-developed, well-nourished, pleasant and cooperative in NAD Airway:  Mallampati 2 Lungs:  Clear throughout to auscultation.   Heart:  Regular rate and rhythm; no murmurs, clicks, rubs,  or gallops. Abdomen:  Soft, nontender and nondistended. Normal bowel sounds.   Neuro/Psych:  Normal mood and affect. A and O x 3   Breion Novacek E. Candis Schatz, MD [facility]

## 2022-04-27 ENCOUNTER — Telehealth: Payer: Self-pay

## 2022-04-27 NOTE — Telephone Encounter (Signed)
  Follow up Call-     04/26/2022    9:31 AM  Call back number  Post procedure Call Back phone  # [phone]  Permission to leave phone message Yes     Patient questions:  Do you have a fever, pain , or abdominal swelling? No. Pain Score  0 *  Have you tolerated food without any problems? Yes.    Have you been able to return to your normal activities? Yes.    Do you have any questions about your discharge instructions: Diet   No. Medications  No. Follow up visit  No.  Do you have questions or concerns about your Care? No.  Actions: * If pain score is 4 or above: No action needed, pain <4.

## 2022-05-03 DIAGNOSIS — M2012 Hallux valgus (acquired), left foot: Secondary | ICD-10-CM | POA: Diagnosis not present

## 2022-05-03 DIAGNOSIS — M2022 Hallux rigidus, left foot: Secondary | ICD-10-CM | POA: Diagnosis not present

## 2022-05-03 DIAGNOSIS — M2011 Hallux valgus (acquired), right foot: Secondary | ICD-10-CM | POA: Diagnosis not present

## 2022-05-03 DIAGNOSIS — M2021 Hallux rigidus, right foot: Secondary | ICD-10-CM | POA: Diagnosis not present

## 2022-05-03 DIAGNOSIS — D2371 Other benign neoplasm of skin of right lower limb, including hip: Secondary | ICD-10-CM | POA: Diagnosis not present

## 2022-05-03 NOTE — Progress Notes (Signed)
Aaron Crane,   Six of the polyps that I removed during your recent procedure were completely benign but were proven to be "pre-cancerous" polyps that MAY have grown into cancers if they had not been removed.  Studies shows that at least 20% of women over age 50 and 30% of men over age 84 have pre-cancerous polyps.  Based on current nationally recognized surveillance guidelines, I recommend that you have a repeat colonoscopy in 3 years.   If you develop any new rectal bleeding, abdominal pain or significant bowel habit changes, please contact me before then.

## 2022-05-27 DIAGNOSIS — L84 Corns and callosities: Secondary | ICD-10-CM | POA: Diagnosis not present

## 2022-05-27 DIAGNOSIS — M25571 Pain in right ankle and joints of right foot: Secondary | ICD-10-CM | POA: Diagnosis not present

## 2022-05-27 DIAGNOSIS — M205X1 Other deformities of toe(s) (acquired), right foot: Secondary | ICD-10-CM | POA: Diagnosis not present

## 2022-06-15 ENCOUNTER — Encounter: Payer: Self-pay | Admitting: *Deleted

## 2022-06-16 ENCOUNTER — Ambulatory Visit: Payer: Medicaid Other | Admitting: Neurology

## 2022-06-16 ENCOUNTER — Encounter: Payer: Self-pay | Admitting: Neurology

## 2022-06-16 VITALS — BP 117/71 | HR 87 | Ht 71.0 in | Wt 274.4 lb

## 2022-06-16 DIAGNOSIS — R0681 Apnea, not elsewhere classified: Secondary | ICD-10-CM

## 2022-06-16 DIAGNOSIS — R635 Abnormal weight gain: Secondary | ICD-10-CM | POA: Diagnosis not present

## 2022-06-16 DIAGNOSIS — R351 Nocturia: Secondary | ICD-10-CM

## 2022-06-16 DIAGNOSIS — E669 Obesity, unspecified: Secondary | ICD-10-CM

## 2022-06-16 DIAGNOSIS — R519 Headache, unspecified: Secondary | ICD-10-CM

## 2022-06-16 DIAGNOSIS — G4733 Obstructive sleep apnea (adult) (pediatric): Secondary | ICD-10-CM

## 2022-06-16 DIAGNOSIS — G4719 Other hypersomnia: Secondary | ICD-10-CM

## 2022-06-16 NOTE — Patient Instructions (Signed)
Thank you for choosing [facility] for your sleep related care! It was nice to meet you today!   Here is what we discussed today:    Based on your symptoms and your exam I believe you are still at risk for obstructive sleep apnea (aka OSA). We should proceed with a sleep study to determine whether you do or do not have OSA and how severe it is. Even, if you have mild OSA, I may want you to consider treatment with CPAP, as treatment of even borderline or mild sleep apnea can result and improvement of symptoms such as sleep disruption, daytime sleepiness, nighttime bathroom breaks, restless leg symptoms, improvement of headache syndromes, even improved mood disorder.   As explained, an attended sleep study (meaning you get to stay overnight in the sleep lab), lets us monitor sleep-related behaviors such as sleep talking and leg movements in sleep, in addition to monitoring for sleep apnea.  A home sleep test is a screening tool for sleep apnea diagnosis only, but unfortunately, does not help with any other sleep-related diagnoses.  Please remember, the long-term risks and ramifications of untreated moderate to severe obstructive sleep apnea may include (but are not limited to): increased risk for cardiovascular disease, including congestive heart failure, stroke, difficult to control hypertension, treatment resistant obesity, arrhythmias, especially irregular heartbeat commonly known as A. Fib. (atrial fibrillation); even type 2 diabetes has been linked to untreated OSA.   Other correlations that untreated obstructive sleep apnea include macular edema which is swelling of the retina in the eyes, droopy eyelid syndrome, and elevated hemoglobin and hematocrit levels (often referred to as polycythemia).  Sleep apnea can cause disruption of sleep and sleep deprivation in most cases, which, in turn, can cause recurrent headaches, problems with memory, mood, concentration, focus, and  vigilance. Most people with untreated sleep apnea report excessive daytime sleepiness, which can affect their ability to drive. Please do not drive or use heavy equipment or machinery, if you feel sleepy! Patients with sleep apnea can also develop difficulty initiating and maintaining sleep (aka insomnia).   Having sleep apnea may increase your risk for other sleep disorders, including involuntary behaviors sleep such as sleep terrors, sleep talking, sleepwalking.    Having sleep apnea can also increase your risk for restless leg syndrome and leg movements at night.   Please note that untreated obstructive sleep apnea may carry additional perioperative morbidity. Patients with significant obstructive sleep apnea (typically, in the moderate to severe degree) should receive, if possible, perioperative PAP (positive airway pressure) therapy and the surgeons and particularly the anesthesiologists should be informed of the diagnosis and the severity of the sleep disordered breathing.   We will call you or email you through MyChart with regards to your test results and plan a follow-up in sleep clinic accordingly. Most likely, you will hear from one of our nurses.   Our sleep lab administrative assistant will call you to schedule your sleep study and give you further instructions, regarding the check in process for the sleep study, arrival time, what to bring, when you can expect to leave after the study, etc., and to answer any other logistical questions you may have. If you don't hear back from her by about 2 weeks from now, please feel free to call her direct line at [phone] or you can call our general clinic number, or email us through My Chart.   

## 2022-06-16 NOTE — Progress Notes (Signed)
Subjective:    Patient ID: Daimian Kinzler is a 52 y.o. male.  HPI    Huston Foley, MAkon Reinoso [facility] [address] [address] [place], [place] [address]  Dear Al Decant,   I saw your patient, Kaushik Maul, upon your kind request in my sleep clinic today for initial consultation of his sleep disorder, in particular, evaluation of his prior diagnosis of obstructive sleep apnea. The patient is unaccompanied today.  As you know, Mr. Chretien is a 52 year old male with an underlying medical history of hypertension, hyperlipidemia, recurrent headaches, history of stroke, and obesity, who was previously diagnosed with obstructive sleep apnea and placed on PAP therapy.  His Epworth sleepiness score is 9/24, fatigue severity score is 22/63. Prior sleep study results are not available for my review today.  Testing was in [place], [place] about 5 or 6 years ago.  As he recalls, he did a home sleep test.  He was given a PAP machine but never had a follow-up.  He has an older model, he has struggled with it because he cannot get replacement supplies, it does not have a humidifier and he has had worsening congestion and has trouble tolerating the cold air.  He was using nasal pillows but admits that he has significant nasal congestion.  He denies taking a nasal decongestion but has tried a saline rinse system before.  His wife is worried about his sleep as he has gasping sounds and pauses in his breathing and loud snoring.  He has nocturia about once per average night and has occasionally woken up with a headache.  He has not used his machine in about 6 months or so, it also turns off randomly at times.  I reviewed your office note from 03/18/2022.  He had blood work through your office at the time including CMP, CBC with differential, lipid panel, and HIV test as well as chlamydia test, results were benign. His bedtime is generally around 10 PM, as late as midnight.  Rise time  is 6:15 AM but he is often awake weight before and has trouble maintaining sleep.  He does not wake up rested.  He has gained weight since his previous sleep study but is currently working on weight loss and thus far has lost about 10 pounds.  He quit smoking some 2 to 3 weeks ago.  He does not drink caffeine daily, he drinks alcohol very occasionally or rarely.  He lives with his wife, no pets in the household, kids are grown.  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  He works in residential remodeling.  He has been sleepy at the wheel before, he admits to having trouble with sleepiness when he was driving to [place].  His wife does not let him drive any longer distances anymore.  His Past Medical History Is Significant For: Past Medical History:  Diagnosis Date   Headache    Hypercholesteremia    on meds   Hypertension    on meds   Sleep apnea    uses CPAP nightly   Stroke 2014    His Past Surgical History Is Significant For: Past Surgical History:  Procedure Laterality Date   NO PAST SURGERIES      His Family History Is Significant For: Family History  Problem Relation Age of Onset   Hypertension Sister    Sleep apnea Maternal Grandfather    Colon polyps Neg Hx  Colon cancer Neg Hx    Esophageal cancer Neg Hx    Rectal cancer Neg Hx    Stomach cancer Neg Hx     His Social History Is Significant For: Social History   Socioeconomic History   Marital status: Married    Spouse name: Not on file   Number of children: 2   Years of education: Not on file   Highest education level: Not on file  Occupational History   Not on file  Tobacco Use   Smoking status: Former    Packs/day: 1.00    Years: 0.00    Additional pack years: 0.00    Total pack years: 0.00    Types: Cigarettes    Quit date: 05/31/2022    Years since quitting: 0.0   Smokeless tobacco: Never  Vaping Use   Vaping Use: Never used  Substance and Sexual Activity    Alcohol use: Yes    Alcohol/week: 0.0 - 1.0 standard drinks of alcohol    Comment: occasional   Drug use: No   Sexual activity: Yes  Other Topics Concern   Not on file  Social History Narrative   Caffiene:  iced coffee (every 2wks)   Work Holiday representative.   Married, 2 kids- grown   Social Determinants of Health   Financial Resource Strain: Not on file  Food Insecurity: Not on file  Transportation Needs: Not on file  Physical Activity: Insufficiently Active (04/25/2018)   Exercise Vital Sign    Days of Exercise per Week: 2 days    Minutes of Exercise per Session: 30 min  Stress: Not on file  Social Connections: Not on file    His Allergies Are:  No Known Allergies:   His Current Medications Are:  Outpatient Encounter Medications as of 06/16/2022  Medication Sig   aspirin EC 81 MG tablet Take 81 mg by mouth daily. Swallow whole.   atorvastatin (LIPITOR) 40 MG tablet Take 40 mg by mouth daily.   lisinopril-hydrochlorothiazide (PRINZIDE,ZESTORETIC) 20-25 MG tablet lisinopril 20 mg-hydrochlorothiazide 25 mg tablet  TAKE 1 TABLET BY MOUTH ONCE DAILY IN THE MORNING   No facility-administered encounter medications on file as of 06/16/2022.  :   Review of Systems:  Out of a complete 14 point review of systems, all are reviewed and negative with the exception of these symptoms as listed below:   Review of Systems  Neurological:         Hypertension, prediabetes, needs new SS and CPAP.  ESS 9  FSS 22.  Had SS 5-6 yrs ago [facility] in [place], [place] was donated machine , not using 6months, did not bring machine,   no f/u when had machine.      Objective:  Neurological Exam  Physical Exam Physical Examination:   Vitals:   06/16/22 0848  BP: 117/71  Pulse: 87    General Examination: The patient is a very pleasant 52 y.o. male in no acute distress. He appears well-developed and well-nourished and well groomed.   HEENT: Normocephalic, atraumatic, pupils are equal, round and  reactive to light, extraocular tracking is good without limitation to gaze excursion or nystagmus noted. Hearing is grossly intact. Face is symmetric with normal facial animation. Speech is clear with no dysarthria noted. There is no hypophonia. There is no lip, neck/head, jaw or voice tremor. Neck is supple with full range of passive and active motion. There are no carotid bruits on auscultation. Oropharynx exam reveals: mild mouth dryness, adequate dental hygiene with several missing  teeth on top, he has partial dentures but not in currently.  Marked airway crowding noted secondary to larger uvula, thicker and larger tongue, tonsillar size of right 2+, left 1-2+.  Mallampati class III.  Neck circumference 18.4 inches.  On nasal inspection he has significant mucosal swelling and inferior turbinate hypertrophy bilaterally, speech is nasal as well and he sounds congested, breathing through his mouth at times.    Chest: Clear to auscultation without wheezing, rhonchi or crackles noted.  Heart: S1+S2+0, regular and normal without murmurs, rubs or gallops noted.   Abdomen: Soft, non-tender and non-distended.  Extremities: There is no pitting edema in the distal lower extremities bilaterally.   Skin: Warm and dry without trophic changes noted.   Musculoskeletal: exam reveals no obvious joint deformities.   Neurologically:  Mental status: The patient is awake, alert and oriented in all 4 spheres. His immediate and remote memory, attention, language skills and fund of knowledge are appropriate. There is no evidence of aphasia, agnosia, apraxia or anomia. Speech is clear with normal prosody and enunciation. Thought process is linear. Mood is normal and affect is normal.  Cranial nerves II - XII are as described above under HEENT exam.  Motor exam: Normal bulk, strength and tone is noted. There is no obvious action or resting tremor.  Fine motor skills and coordination: grossly intact.  Cerebellar testing:  No dysmetria or intention tremor. There is no truncal or gait ataxia.  Sensory exam: intact to light touch in the upper and lower extremities.  Gait, station and balance: He stands easily. No veering to one side is noted. No leaning to one side is noted. Posture is age-appropriate and stance is narrow based. Gait shows normal stride length and normal pace. No problems turning are noted.   Assessment and Plan:   In summary, Jayanth Szczesniak is a very pleasant 52 y.o.-year old male with an underlying medical history of hypertension, hyperlipidemia, recurrent headaches, history of stroke, and obesity, who presents for evaluation of his obstructive sleep apnea.  He was previously diagnosed with sleep apnea and placed on a PAP machine but has not had a machine in about 6 months.  Prior sleep study results are not available for my review, testing was about 5 or 6 years ago.  He is advised to proceed with reevaluation.  While a laboratory attended sleep study is typically considered "gold standard" for evaluation of sleep disordered breathing, we mutually agreed to proceed with a home sleep test at this time, especially since his pretest probability is rather high and we will get him evaluated quicker with a home sleep test.  He is agreeable.   I had a long chat with the patient about my findings and the diagnosis of sleep apnea, particularly OSA, its prognosis and treatment options. We talked about medical/conservative treatments, surgical interventions and non-pharmacological approaches for symptom control. I explained, in particular, the risks and ramifications of untreated moderate to severe OSA, especially with respect to developing cardiovascular disease down the road, including congestive heart failure (CHF), difficult to treat hypertension, cardiac arrhythmias (particularly A-fib), neurovascular complications including TIA, stroke and dementia. Even type 2 diabetes has, in part, been linked to untreated OSA.  Symptoms of untreated OSA may include (but may not be limited to) daytime sleepiness, nocturia (i.e. frequent nighttime urination), memory problems, mood irritability and suboptimally controlled or worsening mood disorder such as depression and/or anxiety, lack of energy, lack of motivation, physical discomfort, as well as recurrent headaches, especially morning or  nocturnal headaches. We talked about the importance of maintaining a healthy lifestyle and striving for healthy weight.  The importance of complete and ongoing smoking cessation was also addressed.  In addition, we talked about the importance of striving for and maintaining good sleep hygiene. I recommended a sleep study at this time. I outlined the differences between a laboratory attended sleep study which is considered more comprehensive and accurate over the option of a home sleep test (HST); the latter may lead to underestimation of sleep disordered breathing in some instances and does not help with diagnosing upper airway resistance syndrome and is not accurate enough to diagnose primary central sleep apnea typically. I outlined possible surgical and non-surgical treatment options of OSA, including the use of a positive airway pressure (PAP) device (i.e. CPAP, AutoPAP/APAP or BiPAP in certain circumstances), a custom-made dental device (aka oral appliance, which would require a referral to a specialist dentist or orthodontist typically, and is generally speaking not considered for patients with full dentures or edentulous state), upper airway surgical options, such as traditional UPPP (which is not considered a first-line treatment) or the Inspire device (hypoglossal nerve stimulator, which would involve a referral for consultation with an ENT surgeon, after careful selection, following inclusion criteria - also not first-line treatment). I explained the PAP treatment option to the patient in detail, as this is generally considered first-line  treatment.  The patient indicated that he would be willing to get back on a PAP machine.  He indicates that he actually benefited from treatment and slept better.  His snoring was alleviated as well.   We will pick up our discussion about the next steps and treatment options after testing.  We will keep him posted as to the test results by phone call and/or MyChart messaging where possible.  We will plan to follow-up in sleep clinic accordingly as well.  I answered all his questions today and the patient was in agreement.   I encouraged him to call with any interim questions, concerns, problems or updates or email [place] through MyChart.  Generally speaking, sleep test authorizations may take up to 2 weeks, sometimes less, sometimes longer, the patient is encouraged to get in touch with [place] if they do not hear back from the sleep lab staff directly within the next 2 weeks.  Thank you very much for allowing me to participate in the care of this nice patient. If I can be of any further assistance to you please do not hesitate to call me at [phone].  Sincerely,   Huston Foley, MD, PhD

## 2022-06-22 ENCOUNTER — Telehealth: Payer: Self-pay | Admitting: Neurology

## 2022-06-22 NOTE — Telephone Encounter (Signed)
HST- [facility] [employer] no auth req   Patient is scheduled at [facility] For 07/13/22 at 8 AM  Mailed packet to the patient.

## 2022-07-12 DIAGNOSIS — M205X1 Other deformities of toe(s) (acquired), right foot: Secondary | ICD-10-CM | POA: Diagnosis not present

## 2022-07-12 DIAGNOSIS — M79671 Pain in right foot: Secondary | ICD-10-CM | POA: Diagnosis not present

## 2022-07-13 ENCOUNTER — Ambulatory Visit: Payer: Medicaid Other | Admitting: Neurology

## 2022-07-13 DIAGNOSIS — R635 Abnormal weight gain: Secondary | ICD-10-CM

## 2022-07-13 DIAGNOSIS — R519 Headache, unspecified: Secondary | ICD-10-CM

## 2022-07-13 DIAGNOSIS — G4733 Obstructive sleep apnea (adult) (pediatric): Secondary | ICD-10-CM | POA: Diagnosis not present

## 2022-07-13 DIAGNOSIS — R351 Nocturia: Secondary | ICD-10-CM

## 2022-07-13 DIAGNOSIS — G4719 Other hypersomnia: Secondary | ICD-10-CM

## 2022-07-13 DIAGNOSIS — E669 Obesity, unspecified: Secondary | ICD-10-CM

## 2022-07-13 DIAGNOSIS — R0681 Apnea, not elsewhere classified: Secondary | ICD-10-CM

## 2022-07-14 NOTE — Addendum Note (Signed)
Addended by: Huston Foley on: 07/14/2022 05:36 PM   Modules accepted: Orders

## 2022-07-14 NOTE — Progress Notes (Signed)
See procedure note.

## 2022-07-14 NOTE — Procedures (Signed)
[facility]  HOME SLEEP TEST (Watch PAT) REPORT  STUDY DATE: 07/13/2022  DOB: 1970-08-30  MRN: [id number]  ORDERING CLINICIAN: Huston Foley, MD, PhD   REFERRING CLINICIAN: Delfino Lovett, NP  CLINICAL INFORMATION/HISTORY: 52 year old male with an underlying medical history of hypertension, hyperlipidemia, recurrent headaches, history of stroke, and obesity, who was previously diagnosed with obstructive sleep apnea and placed on PAP therapy. He has an older model, and has struggled with it because he cannot get replacement supplies, it does not have a humidifier and he has had worsening congestion and has trouble tolerating the cold air.    Epworth sleepiness score: 9/24.  BMI: 38.3 kg/m  FINDINGS:   Sleep Summary:   Total Recording Time (hours, min): 7 hours, 59 min  Total Sleep Time (hours, min):  6 hours, 51 min  Percent REM (%):    24.7%   Respiratory Indices:   Calculated pAHI (per hour):  24.9/hour         REM pAHI:    49.7/hour       NREM pAHI: 19.2/hour  Central pAHI: 1.9/hour  Oxygen Saturation Statistics:    Oxygen Saturation (%) Mean: 94%   Minimum oxygen saturation (%):                 76%   O2 Saturation Range (%): 76 - 99%    O2 Saturation (minutes) <=88%: 10 min  Pulse Rate Statistics:   Pulse Mean (bpm):    77/min    Pulse Range (48 - 105/min)   IMPRESSION: OSA (obstructive sleep apnea), moderate Nocturnal hypoxemia  RECOMMENDATION:  This home sleep test demonstrates moderate obstructive sleep apnea with a total AHI of 24.9/hour and O2 nadir of 76% with significant time below or at 88% saturation of 10 minutes for the night, indicating nocturnal hypoxemia.  Snoring was noted mostly in the moderate range.  Ongoing treatment with a positive airway pressure (PAP) device is recommended. The patient will be advised to proceed with an autoPAP titration/trial at home for now.  I will prescribe a new machine for him.  A full  night titration study may be considered to optimize treatment settings, monitor proper oxygen saturations and aid with improvement of tolerance and adherence, if needed down the road. Alternative treatment options may include a dental device through dentistry or orthodontics in selected patients or Inspire (hypoglossal nerve stimulator) in carefully selected patients (meeting inclusion criteria).  Concomitant weight loss is recommended (where clinically appropriate). Please note that untreated obstructive sleep apnea may carry additional perioperative morbidity. Patients with significant obstructive sleep apnea should receive perioperative PAP therapy and the surgeons and particularly the anesthesiologist should be informed of the diagnosis and the severity of the sleep disordered breathing. The patient should be cautioned not to drive, work at heights, or operate dangerous or heavy equipment when tired or sleepy. Review and reiteration of good sleep hygiene measures should be pursued with any patient. Other causes of the patient's symptoms, including circadian rhythm disturbances, an underlying mood disorder, medication effect and/or an underlying medical problem cannot be ruled out based on this test. Clinical correlation is recommended.  The patient and his referring provider will be notified of the test results. The patient will be seen in follow up in sleep clinic at [facility].  I certify that I have reviewed the raw data recording prior to the issuance of this report in accordance with the standards of the American Academy of Sleep Medicine (AASM).  INTERPRETING PHYSICIAN:   Huston Foley, MD, PhD Medical Director, [facility]) Diplomat, ABPN (Neurology and Sleep)   [facility] [address] [place], [place] [address] [phone]

## 2022-07-15 ENCOUNTER — Telehealth: Payer: Self-pay

## 2022-07-15 NOTE — Telephone Encounter (Signed)
-----   Message from Huston Foley, MD sent at 07/14/2022  5:36 PM EDT ----- Patient referred by Delfino Lovett, NP, seen by me on 06/16/2022 for reevaluation of his OSA, patient had a HST on 07/13/2022.    Please call and notify the patient that the recent home sleep test showed obstructive sleep apnea in the moderate range. I would like to write a new AutoPap machine for him.  If he has no DME company, we can get him established with a local company.  The DME representative will fit him with a mask, educate him on how to use the machine, how to put the mask on, etc. I have placed an order in the chart. Please send the order, talk to patient, send report to referring MD. We will need a FU in sleep clinic for 10 weeks post-PAP set up, please arrange that with me or one of our NPs. Also reinforce the need for compliance with treatment. Thanks,   Huston Foley, MD, PhD [facility])

## 2022-07-15 NOTE — Telephone Encounter (Signed)
I called pt. I advised pt that Dr. Frances Furbish reviewed their sleep study results and found that pt has moderate OSA. Dr. Frances Furbish recommends that pt continues PAP therapy. I reviewed PAP compliance expectations with the pt. Pt is agreeable to starting a CPAP. I advised pt that an order will be sent to a DME, [facility], and [facility] will call the pt within about one week after they file with the pt's insurance. [facility] will show the pt how to use the machine, fit for masks, and troubleshoot the CPAP if needed. A follow up appt was made for insurance purposes with Amy Lomax on 8/22 9am . Pt verbalized understanding to arrive 15 minutes early and bring their CPAP. Pt verbalized understanding of results. Pt had no questions at this time but was encouraged to call back if questions arise. I have sent the order to [facility] and have received confirmation that they have received the order.

## 2022-08-04 DIAGNOSIS — G4733 Obstructive sleep apnea (adult) (pediatric): Secondary | ICD-10-CM | POA: Diagnosis not present

## 2022-08-30 DIAGNOSIS — G4733 Obstructive sleep apnea (adult) (pediatric): Secondary | ICD-10-CM | POA: Diagnosis not present

## 2022-09-01 DIAGNOSIS — G4733 Obstructive sleep apnea (adult) (pediatric): Secondary | ICD-10-CM | POA: Diagnosis not present

## 2022-09-07 ENCOUNTER — Emergency Department: Admission: EM | Admit: 2022-09-07 | Payer: Medicaid Other | Source: Home / Self Care

## 2022-09-07 ENCOUNTER — Other Ambulatory Visit: Payer: Self-pay

## 2022-09-07 ENCOUNTER — Emergency Department: Payer: Medicaid Other

## 2022-09-07 DIAGNOSIS — I517 Cardiomegaly: Secondary | ICD-10-CM | POA: Diagnosis not present

## 2022-09-07 DIAGNOSIS — R0789 Other chest pain: Secondary | ICD-10-CM | POA: Diagnosis not present

## 2022-09-07 DIAGNOSIS — I10 Essential (primary) hypertension: Secondary | ICD-10-CM | POA: Diagnosis not present

## 2022-09-07 DIAGNOSIS — M542 Cervicalgia: Secondary | ICD-10-CM | POA: Diagnosis not present

## 2022-09-07 LAB — COMPREHENSIVE METABOLIC PANEL
ALT: 23 U/L (ref 0–44)
AST: 30 U/L (ref 15–41)
Albumin: 4.4 g/dL (ref 3.5–5.0)
Alkaline Phosphatase: 73 U/L (ref 38–126)
Anion gap: 10 (ref 5–15)
BUN: 17 mg/dL (ref 6–20)
CO2: 27 mmol/L (ref 22–32)
Calcium: 9.7 mg/dL (ref 8.9–10.3)
Chloride: 96 mmol/L — ABNORMAL LOW (ref 98–111)
Creatinine, Ser: 1.02 mg/dL (ref 0.61–1.24)
GFR, Estimated: >60 mL/min (ref >60)
Glucose, Bld: 109 mg/dL — ABNORMAL HIGH (ref 70–99)
Potassium: 3.1 mmol/L — ABNORMAL LOW (ref 3.5–5.1)
Sodium: 133 mmol/L — ABNORMAL LOW (ref 135–145)
Total Bilirubin: 0.9 mg/dL (ref 0.3–1.2)
Total Protein: 7.8 g/dL (ref 6.5–8.1)

## 2022-09-07 LAB — CBC
HCT: 42.5 % (ref 39.0–52.0)
Hemoglobin: 14.5 g/dL (ref 13.0–17.0)
MCH: 31.6 pg (ref 26.0–34.0)
MCHC: 34.1 g/dL (ref 30.0–36.0)
MCV: 92.6 fL (ref 80.0–100.0)
Platelets: 307 10*3/uL (ref 150–400)
RBC: 4.59 MIL/uL (ref 4.22–5.81)
RDW: 12 % (ref 11.5–15.5)
WBC: 11 10*3/uL — ABNORMAL HIGH (ref 4.0–10.5)
nRBC: 0 % (ref 0.0–0.2)

## 2022-09-07 LAB — TROPONIN I (HIGH SENSITIVITY)
Troponin I (High Sensitivity): 5 ng/L (ref <18)
Troponin I (High Sensitivity): 5 ng/L (ref <18)

## 2022-09-07 NOTE — ED Triage Notes (Signed)
Pt presents to ER with c/o left side chest and neck pain that has been going on for appx 1 week.  Pt states he has hx of stroke, HTN and high cholesterol.  States he has just not felt like himself recently.  Pt denies any neurological sx at this time.  States his chest pain gets worse with certain movements.  Endorses some mild SOB.  Pt is otherwise A&O x4 and in NAD on arrival.

## 2022-09-07 NOTE — ED Provider Notes (Signed)
[facility] Provider Note    Event Date/Time   First MD Initiated Contact with Patient 09/07/22 2336     (approximate)   History   Chest Pain and Neck Pain   HPI  Aaron Crane is a 52 y.o. male with a history of hypertension, hyperlipidemia, sleep apnea on CPAP, and stroke in 2015 who presents with left-sided chest and neck pain for approximate last 10 days.  The patient states he initially had some pain in the left side of his neck that felt like he had a "crook in my neck."  This has mostly subsided but over the last several days the patient has now had pain rating from the neck into the left arm, and occasionally has shooting pain into the left side of his chest.  This is usually when he is up and moving around.  It lasts for a few minutes and then resolves.  He states he also has a feeling in his chest as if he has gas, but he does not have gas.  All of the symptoms are intermittent.  They are not worse with exertion.  The patient denies any shortness of breath or lightheadedness.  He has no nausea or vomiting.  Denies any prior history of similar pain.  He has no weakness or numbness in the arms or legs.  I reviewed the past medical records.  The patient's most recent outpatient counter was on 4/17 with [facility] for sleep evaluation.   Physical Exam   Triage Vital Signs: ED Triage Vitals [09/07/22 1923]  Enc Vitals Group     BP (!) 156/92     Pulse Rate 87     Resp 19     Temp 98.4 F (36.9 C)     Temp Source Oral     SpO2 96 %     Weight 270 lb (122.5 kg)     Height 5\' 11"  (1.803 m)     Head Circumference      Peak Flow      Pain Score 8     Pain Loc      Pain Edu?      Excl. in GC?     Most recent vital signs: Vitals:   09/08/22 0000 09/08/22 0017  BP: 117/82   Pulse: 82   Resp: 19   Temp:  98.1 F (36.7 C)  SpO2: 100%      General: Awake, no distress.  CV:  Good peripheral perfusion.  Normal heart  sounds. Resp:  Normal effort.  Lungs CTAB. Abd:  No distention.  Other:  Neck supple with full ROM.  5/5 motor strength and intact sensation of bilateral upper and lower extremities.  EOMI.  PERRLA.  No facial droop.  No ataxia.   ED Results / Procedures / Treatments   Labs (all labs ordered are listed, but only abnormal results are displayed) Labs Reviewed  CBC - Abnormal; Notable for the following components:      Result Value   WBC 11.0 (*)    All other components within normal limits  COMPREHENSIVE METABOLIC PANEL - Abnormal; Notable for the following components:   Sodium 133 (*)    Potassium 3.1 (*)    Chloride 96 (*)    Glucose, Bld 109 (*)    All other components within normal limits  TROPONIN I (HIGH SENSITIVITY)  TROPONIN I (HIGH SENSITIVITY)     EKG  ED ECG REPORT I, Dionne Bucy, the attending physician, personally  viewed and interpreted this ECG.  Date: 09/08/2022 EKG Time: 1921 Rate: 83 Rhythm: normal sinus rhythm QRS Axis: normal Intervals: normal ST/T Wave abnormalities: Nonspecific T wave abnormalities Narrative Interpretation: no evidence of acute ischemia; no significant change when compared to EKG of 04/09/2019    RADIOLOGY  Chest x-ray: I independently viewed and interpreted the images; there is no focal consolidation or edema   PROCEDURES:  Critical Care performed: No  Procedures   MEDICATIONS ORDERED IN ED: Medications - No data to display   IMPRESSION / MDM / ASSESSMENT AND PLAN / ED COURSE  I reviewed the triage vital signs and the nursing notes.  52 year old male with PMH as noted above presents with atypical intermittent left-sided neck, arm, and chest pain over the last 10 days.  He has no concerning associated symptoms.  On exam his vital signs are normal.  He is well-appearing.  The physical exam is otherwise unremarkable for acute findings.  EKG is nonischemic.  Chest x-ray is clear.  Initial and repeat troponins are  both negative.  BMP and CBC show no acute findings.  Differential diagnosis includes, but is not limited to, radiculopathy, other nerve pain, musculoskeletal chest wall pain, GERD, less likely ACS.  At this time given the nonischemic EKG and negative troponins x 2 the patient has been ruled out for ACS.  There is no clinical evidence for PE, or for aortic dissection or other vascular cause given the normal vital signs and intermittent pain.  Patient's presentation is most consistent with acute complicated illness / injury requiring diagnostic workup.  The patient is on the cardiac monitor to evaluate for evidence of arrhythmia and/or significant heart rate changes.  The patient is currently pain-free.  Given the negative workup, he is stable for discharge home.  I counseled him on the results of the workup and likely etiology of his symptoms.  I recommend primary care follow-up and have also given him a cardiology referral given his risk factors although this specific presentation is not consistent with cardiac etiology.  I gave strict return precautions and he expressed understanding.   FINAL CLINICAL IMPRESSION(S) / ED DIAGNOSES   Final diagnoses:  Atypical chest pain     Rx / DC Orders   ED Discharge Orders          Ordered    Ambulatory referral to Cardiology       Comments: If you have not heard from the Cardiology office within the next 72 hours please call [phone].   09/08/22 0013             Note:  This document was prepared using Dragon voice recognition software and may include unintentional dictation errors.    Dionne Bucy, MD 09/08/22 [phone]

## 2022-09-08 NOTE — Discharge Instructions (Signed)
Follow up with your primary care provider.  A referral to cardiology has also been provided.  Return to the ER for new, worsening, or persistent severe chest pain, difficulty breathing, weakness or lightheadedness, any numbness or strokelike symptoms, or any other new or worsening symptoms that concern you.

## 2022-09-29 DIAGNOSIS — G4733 Obstructive sleep apnea (adult) (pediatric): Secondary | ICD-10-CM | POA: Diagnosis not present

## 2022-09-30 DIAGNOSIS — G4733 Obstructive sleep apnea (adult) (pediatric): Secondary | ICD-10-CM | POA: Diagnosis not present

## 2022-10-11 DIAGNOSIS — H5213 Myopia, bilateral: Secondary | ICD-10-CM | POA: Diagnosis not present

## 2022-10-20 NOTE — Patient Instructions (Incomplete)
Please continue using your CPAP regularly. While your insurance requires that you use CPAP at least 4 hours each night on 70% of the nights, I recommend, that you not skip any nights and use it throughout the night if you can. Getting used to CPAP and staying with the treatment long term does take time and patience and discipline. Untreated obstructive sleep apnea when it is moderate to severe can have an adverse impact on cardiovascular health and raise her risk for heart disease, arrhythmias, hypertension, congestive heart failure, stroke and diabetes. Untreated obstructive sleep apnea causes sleep disruption, nonrestorative sleep, and sleep deprivation. This can have an impact on your day to day functioning and cause daytime sleepiness and impairment of cognitive function, memory loss, mood disturbance, and problems focussing. Using CPAP regularly can improve these symptoms.  We will update supply orders, today.   Follow up in 6 months

## 2022-10-20 NOTE — Progress Notes (Unsigned)
PATIENT: Aaron Crane DOB: 26-Apr-1970  REASON FOR VISIT: follow up HISTORY FROM: patient  No chief complaint on file.    HISTORY OF PRESENT ILLNESS:  10/20/22 ALL:  Aaron Crane is a 52 y.o. male here today for follow up for OSA on CPAP. He was seen in consult with Dr Frances Furbish 05/2022 for history of OSA but not on CPAP therapy. HST 06/2022 showed "moderate obstructive sleep apnea with a total AHI of 24.9/hour and O2 nadir of 76% with significant time below or at 88% saturation of 10 minutes for the night, indicating nocturnal hypoxemia." AutoPAP advised. Since,      HISTORY: (copied from Dr Teofilo Pod previous note)  Dear Aaron Crane,    I saw your patient, Aaron Crane, upon your kind request in my sleep clinic today for initial consultation of his sleep disorder, in particular, evaluation of his prior diagnosis of obstructive sleep apnea. The patient is unaccompanied today.  As you know, Aaron Crane is a 52 year old male with an underlying medical history of hypertension, hyperlipidemia, recurrent headaches, history of stroke, and obesity, who was previously diagnosed with obstructive sleep apnea and placed on PAP therapy.  His Epworth sleepiness score is 9/24, fatigue severity score is 22/63. Prior sleep study results are not available for my review today.  Testing was in [place], [place] about 5 or 6 years ago.  As he recalls, he did a home sleep test.  He was given a PAP machine but never had a follow-up.  He has an older model, he has struggled with it because he cannot get replacement supplies, it does not have a humidifier and he has had worsening congestion and has trouble tolerating the cold air.  He was using nasal pillows but admits that he has significant nasal congestion.  He denies taking a nasal decongestion but has tried a saline rinse system before.  His wife is worried about his sleep as he has gasping sounds and pauses in his breathing and loud snoring.  He has nocturia  about once per average night and has occasionally woken up with a headache.  He has not used his machine in about 6 months or so, it also turns off randomly at times.  I reviewed your office note from 03/18/2022.  He had blood work through your office at the time including CMP, CBC with differential, lipid panel, and HIV test as well as chlamydia test, results were benign. His bedtime is generally around 10 PM, as late as midnight.  Rise time is 6:15 AM but he is often awake weight before and has trouble maintaining sleep.  He does not wake up rested.  He has gained weight since his previous sleep study but is currently working on weight loss and thus far has lost about 10 pounds.  He quit smoking some 2 to 3 weeks ago.  He does not drink caffeine daily, he drinks alcohol very occasionally or rarely.  He lives with his wife, no pets in the household, kids are grown.  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  He works in residential remodeling.  He has been sleepy at the wheel before, he admits to having trouble with sleepiness when he was driving to [place].  His wife does not let him drive any longer distances anymore   REVIEW OF SYSTEMS: Out of a complete 14 system review of symptoms, the patient complains only of the following symptoms, and all other reviewed  systems are negative.  ESS:  ALLERGIES: No Known Allergies  HOME MEDICATIONS: Outpatient Medications Prior to Visit  Medication Sig Dispense Refill   aspirin EC 81 MG tablet Take 81 mg by mouth daily. Swallow whole.     atorvastatin (LIPITOR) 40 MG tablet Take 40 mg by mouth daily.     lisinopril-hydrochlorothiazide (PRINZIDE,ZESTORETIC) 20-25 MG tablet lisinopril 20 mg-hydrochlorothiazide 25 mg tablet  TAKE 1 TABLET BY MOUTH ONCE DAILY IN THE MORNING     No facility-administered medications prior to visit.    PAST MEDICAL HISTORY: Past Medical History:  Diagnosis Date   Headache     Hypercholesteremia    on meds   Hypertension    on meds   Sleep apnea    uses CPAP nightly   Stroke (HCC) 2014    PAST SURGICAL HISTORY: Past Surgical History:  Procedure Laterality Date   NO PAST SURGERIES      FAMILY HISTORY: Family History  Problem Relation Age of Onset   Hypertension Sister    Sleep apnea Maternal Grandfather    Colon polyps Neg Hx    Colon cancer Neg Hx    Esophageal cancer Neg Hx    Rectal cancer Neg Hx    Stomach cancer Neg Hx     SOCIAL HISTORY: Social History   Socioeconomic History   Marital status: Married    Spouse name: Not on file   Number of children: 2   Years of education: Not on file   Highest education level: Not on file  Occupational History   Not on file  Tobacco Use   Smoking status: Former    Current packs/day: 0.00    Types: Cigarettes    Start date: 05/31/2022    Quit date: 05/31/2022    Years since quitting: 0.3   Smokeless tobacco: Never  Vaping Use   Vaping status: Never Used  Substance and Sexual Activity   Alcohol use: Yes    Alcohol/week: 0.0 - 1.0 standard drinks of alcohol    Comment: occasional   Drug use: No   Sexual activity: Yes  Other Topics Concern   Not on file  Social History Narrative   Caffiene:  iced coffee (every 2wks)   Work Holiday representative.   Married, 2 kids- grown   Social Determinants of Health   Financial Resource Strain: Not on file  Food Insecurity: Not on file  Transportation Needs: Not on file  Physical Activity: Insufficiently Active (04/25/2018)   Exercise Vital Sign    Days of Exercise per Week: 2 days    Minutes of Exercise per Session: 30 min  Stress: Not on file  Social Connections: Not on file  Intimate Partner Violence: Not on file     PHYSICAL EXAM  There were no vitals filed for this visit. There is no height or weight on file to calculate BMI.  Generalized: Well developed, in no acute distress  Cardiology: normal rate and rhythm, no murmur noted Respiratory:  clear to auscultation bilaterally  Neurological examination  Mentation: Alert oriented to time, place, history taking. Follows all commands speech and language fluent Cranial nerve II-XII: Pupils were equal round reactive to light. Extraocular movements were full, visual field were full  Motor: The motor testing reveals 5 over 5 strength of all 4 extremities. Good symmetric motor tone is noted throughout.  Gait and station: Gait is normal.    DIAGNOSTIC DATA (LABS, IMAGING, TESTING) - I reviewed patient records, labs, notes, testing and imaging myself where available.  No data to display           Lab Results  Component Value Date   WBC 11.0 (H) 09/07/2022   HGB 14.5 09/07/2022   HCT 42.5 09/07/2022   MCV 92.6 09/07/2022   PLT 307 09/07/2022      Component Value Date/Time   NA 133 (L) 09/07/2022 1925   K 3.1 (L) 09/07/2022 1925   CL 96 (L) 09/07/2022 1925   CO2 27 09/07/2022 1925   GLUCOSE 109 (H) 09/07/2022 1925   BUN 17 09/07/2022 1925   CREATININE 1.02 09/07/2022 1925   CALCIUM 9.7 09/07/2022 1925   PROT 7.8 09/07/2022 1925   ALBUMIN 4.4 09/07/2022 1925   AST 30 09/07/2022 1925   ALT 23 09/07/2022 1925   ALKPHOS 73 09/07/2022 1925   BILITOT 0.9 09/07/2022 1925   GFRNONAA >60 09/07/2022 1925   GFRAA >60 04/24/2017 1145   No results found for: "CHOL", "HDL", "LDLCALC", "LDLDIRECT", "TRIG", "CHOLHDL" No results found for: "HGBA1C" No results found for: "VITAMINB12" No results found for: "TSH"   ASSESSMENT AND PLAN 52 y.o. year old male  has a past medical history of Headache, Hypercholesteremia, Hypertension, Sleep apnea, and Stroke (HCC) (2014). here with   No diagnosis found.    Vivin Fly is doing well on CPAP therapy. Compliance report reveals ***. *** was encouraged to continue using CPAP nightly and for greater than 4 hours each night. We will update supply orders as indicated. Risks of untreated sleep apnea review and education materials  provided. Healthy lifestyle habits encouraged. *** will follow up in ***, sooner if needed. *** verbalizes understanding and agreement with this plan.    No orders of the defined types were placed in this encounter.    No orders of the defined types were placed in this encounter.     Shawnie Dapper, FNP-C 10/20/2022, 10:10 AM [facility] [address], [address] [place], [place] [address] [phone]

## 2022-10-21 ENCOUNTER — Ambulatory Visit: Payer: Medicaid Other | Admitting: Family Medicine

## 2022-10-21 ENCOUNTER — Encounter: Payer: Self-pay | Admitting: Family Medicine

## 2022-10-21 VITALS — BP 119/73 | HR 84 | Ht 71.0 in | Wt 276.0 lb

## 2022-10-21 DIAGNOSIS — G4733 Obstructive sleep apnea (adult) (pediatric): Secondary | ICD-10-CM | POA: Diagnosis not present

## 2022-10-28 DIAGNOSIS — G4733 Obstructive sleep apnea (adult) (pediatric): Secondary | ICD-10-CM | POA: Diagnosis not present

## 2022-10-31 DIAGNOSIS — G4733 Obstructive sleep apnea (adult) (pediatric): Secondary | ICD-10-CM | POA: Diagnosis not present

## 2022-11-10 NOTE — H&P (View-Only) (Signed)
  Cardiology Office Note:  .   Date:  11/10/2022  ID:  Aaron Crane, DOB Sep 14, 1970, MRN [id number] PCP: Delfino Lovett, FNP  [facility] Providers Cardiologist:  Jodelle Red, MD { Click to update primary MD,subspecialty MD or APP then REFRESH:1}   History of Present Illness: .   Aaron Crane is a 52 y.o. male HTN, HLD OSA on CPAP, here for an acute visit. Referral from the ED for L chest and neck pain in July. EKG showed non specific T wave changes. Troponin was negative.  ROS:  per HPI otherwise negative   Studies Reviewed: .        *** Risk Assessment/Calculations:        Physical Exam:   VS:   *** Wt Readings from Last 3 Encounters:  10/21/22 276 lb (125.2 kg)  09/07/22 270 lb (122.5 kg)  06/16/22 274 lb 6.4 oz (124.5 kg)    GEN: Well nourished, well developed in no acute distress NECK: No JVD; No carotid bruits CARDIAC: ***RRR, no murmurs, rubs, gallops RESPIRATORY:  Clear to auscultation without rales, wheezing or rhonchi  ABDOMEN: Soft, non-tender, non-distended EXTREMITIES:  No edema; No deformity   ASSESSMENT AND PLAN: .   Non cardiac CP -his presentation and findings are not c/w cardiac CP; c/f MSK  HTN -     Dispo: ***  Signed, Maisie Fus, MD

## 2022-11-10 NOTE — Progress Notes (Unsigned)
  Cardiology Office Note:  .   Date:  11/10/2022  ID:  Aaron Crane, DOB Sep 14, 1970, MRN [id number] PCP: Delfino Lovett, FNP  [facility] Providers Cardiologist:  Jodelle Red, MD { Click to update primary MD,subspecialty MD or APP then REFRESH:1}   History of Present Illness: .   Aaron Crane is a 52 y.o. male HTN, HLD OSA on CPAP, here for an acute visit. Referral from the ED for L chest and neck pain in July. EKG showed non specific T wave changes. Troponin was negative.  ROS:  per HPI otherwise negative   Studies Reviewed: .        *** Risk Assessment/Calculations:        Physical Exam:   VS:   *** Wt Readings from Last 3 Encounters:  10/21/22 276 lb (125.2 kg)  09/07/22 270 lb (122.5 kg)  06/16/22 274 lb 6.4 oz (124.5 kg)    GEN: Well nourished, well developed in no acute distress NECK: No JVD; No carotid bruits CARDIAC: ***RRR, no murmurs, rubs, gallops RESPIRATORY:  Clear to auscultation without rales, wheezing or rhonchi  ABDOMEN: Soft, non-tender, non-distended EXTREMITIES:  No edema; No deformity   ASSESSMENT AND PLAN: .   Non cardiac CP -his presentation and findings are not c/w cardiac CP; c/f MSK  HTN -     Dispo: ***  Signed, Maisie Fus, MD

## 2022-11-11 ENCOUNTER — Telehealth: Payer: Self-pay | Admitting: *Deleted

## 2022-11-11 ENCOUNTER — Ambulatory Visit: Payer: Medicaid Other | Attending: Internal Medicine | Admitting: Internal Medicine

## 2022-11-11 ENCOUNTER — Encounter: Payer: Self-pay | Admitting: Internal Medicine

## 2022-11-11 VITALS — BP 114/82 | HR 82 | Ht 71.0 in | Wt 274.0 lb

## 2022-11-11 DIAGNOSIS — R072 Precordial pain: Secondary | ICD-10-CM

## 2022-11-11 DIAGNOSIS — R0789 Other chest pain: Secondary | ICD-10-CM | POA: Diagnosis not present

## 2022-11-11 MED ORDER — METOPROLOL TARTRATE 100 MG PO TABS: 100.0000 mg | ORAL_TABLET | Freq: Once | ORAL | 0 refills | Status: DC

## 2022-11-11 NOTE — Telephone Encounter (Signed)
-----   Message from Lorrin Jackson sent at 11/11/2022 11:05 AM EDT ----- Regarding: ct scan Scheduled 11/18/22 at 3:00

## 2022-11-11 NOTE — Patient Instructions (Signed)
Medication Instructions:   Your physician recommends that you continue on your current medications as directed. Please refer to the Current Medication list given to you today.  *If you need a refill on your cardiac medications before your next appointment, please call your pharmacy*    Lab Work:  TODAY--BMET  If you have labs (blood work) drawn today and your tests are completely normal, you will receive your results only by: MyChart Message (if you have MyChart) OR A paper copy in the mail If you have any lab test that is abnormal or we need to change your treatment, we will call you to review the results.    Testing/Procedures:    Your cardiac CT will be scheduled at one of the below locations:   [facility] [address] [place], [place] [address] [phone]   If scheduled at [facility], please arrive at the [facility] and Children's Entrance (Entrance C2) of [facility] 30 minutes prior to test start time. You can use the FREE valet parking offered at entrance C (encouraged to control the heart rate for the test)  Proceed to the [facility] Radiology Department (first floor) to check-in and test prep.  All radiology patients and guests should use entrance C2 at [facility], accessed from [facility], even though the hospital's physical address listed is [address]      There is spacious parking and easy access to the radiology department from the [facility] entrance. Please enter here and check-in with the desk attendant.   Please follow these instructions carefully (unless otherwise directed):  An IV will be required for this test and Nitroglycerin will be given.  Hold all erectile dysfunction medications at least 3 days (72 hrs) prior to test. (Ie viagra, cialis, sildenafil, tadalafil, etc)   On the Night Before the Test: Be sure to Drink plenty of water. Do not consume any  caffeinated/decaffeinated beverages or chocolate 12 hours prior to your test. Do not take any antihistamines 12 hours prior to your test.   On the Day of the Test: Drink plenty of water until 1 hour prior to the test. Do not eat any food 1 hour prior to test. You may take your regular medications prior to the test.  Take metoprolol 100 MG BY MOUTH (Lopressor) two hours prior to test. If you take LISINOPRIL-HYDROCHLOROTHIAZIDE, please HOLD on the morning of the test.       After the Test: Drink plenty of water. After receiving IV contrast, you may experience a mild flushed feeling. This is normal. On occasion, you may experience a mild rash up to 24 hours after the test. This is not dangerous. If this occurs, you can take Benadryl 25 mg and increase your fluid intake. If you experience trouble breathing, this can be serious. If it is severe call 911 IMMEDIATELY. If it is mild, please call our office.   We will call to schedule your test 2-4 weeks out understanding that some insurance companies will need an authorization prior to the service being performed.   For more information and frequently asked questions, please visit our website : [email or web address]  For non-scheduling related questions, please contact the cardiac imaging nurse navigator should you have any questions/concerns: Cardiac Imaging Nurse Navigators Direct Office Dial: [phone]   For scheduling needs, including cancellations and rescheduling, please call [place], [phone].    Follow-Up:  AS NEEDED WITH DR. BRANCH AND/OR PENDING YOUR TEST RESULTS

## 2022-11-12 LAB — BASIC METABOLIC PANEL
BUN/Creatinine Ratio: 14 (ref 9–20)
BUN: 14 mg/dL (ref 6–24)
CO2: 30 mmol/L — ABNORMAL HIGH (ref 20–29)
Calcium: 10.2 mg/dL (ref 8.7–10.2)
Chloride: 97 mmol/L (ref 96–106)
Creatinine, Ser: 0.98 mg/dL (ref 0.76–1.27)
Glucose: 98 mg/dL (ref 70–99)
Potassium: 4.4 mmol/L (ref 3.5–5.2)
Sodium: 139 mmol/L (ref 134–144)
eGFR: 94 mL/min/{1.73_m2} (ref >59)

## 2022-11-16 ENCOUNTER — Encounter (HOSPITAL_COMMUNITY): Payer: Self-pay

## 2022-11-17 ENCOUNTER — Telehealth (HOSPITAL_COMMUNITY): Payer: Self-pay | Admitting: *Deleted

## 2022-11-17 NOTE — Telephone Encounter (Signed)
Reaching out to patient to offer assistance regarding upcoming cardiac imaging study; pt verbalizes understanding of appt date/time, parking situation and where to check in, pre-test NPO status and medications ordered, and verified current allergies; name and call back number provided for further questions should they arise Johney Frame RN Navigator Cardiac Imaging [facility] [phone] office [phone] cell

## 2022-11-18 ENCOUNTER — Ambulatory Visit (HOSPITAL_COMMUNITY)
Admission: RE | Admit: 2022-11-18 | Discharge: 2022-11-18 | Disposition: A | Discharge status: Home / Self Care | Payer: Medicaid Other | Source: Ambulatory Visit | Attending: Internal Medicine | Admitting: Internal Medicine

## 2022-11-18 ENCOUNTER — Other Ambulatory Visit: Payer: Self-pay | Admitting: Cardiovascular Disease

## 2022-11-18 ENCOUNTER — Ambulatory Visit (HOSPITAL_COMMUNITY)
Admission: RE | Admit: 2022-11-18 | Discharge: 2022-11-18 | Disposition: A | Discharge status: Home / Self Care | Payer: Medicaid Other | Source: Ambulatory Visit | Attending: Cardiovascular Disease | Admitting: Cardiovascular Disease

## 2022-11-18 DIAGNOSIS — R931 Abnormal findings on diagnostic imaging of heart and coronary circulation: Secondary | ICD-10-CM

## 2022-11-18 DIAGNOSIS — R0789 Other chest pain: Secondary | ICD-10-CM | POA: Diagnosis present

## 2022-11-18 DIAGNOSIS — R072 Precordial pain: Secondary | ICD-10-CM | POA: Diagnosis present

## 2022-11-18 DIAGNOSIS — I2510 Atherosclerotic heart disease of native coronary artery without angina pectoris: Secondary | ICD-10-CM

## 2022-11-18 MED ORDER — IOHEXOL 350 MG/ML SOLN
95.0000 mL | Freq: Once | INTRAVENOUS | Status: AC | PRN
  Administered 2022-11-18: 95 mL via INTRAVENOUS

## 2022-11-18 MED ORDER — NITROGLYCERIN 0.4 MG SL SUBL
SUBLINGUAL_TABLET | SUBLINGUAL | Status: AC
  Filled 2022-11-18: qty 2

## 2022-11-18 MED ORDER — NITROGLYCERIN 0.4 MG SL SUBL
0.8000 mg | SUBLINGUAL_TABLET | Freq: Once | SUBLINGUAL | Status: AC
  Administered 2022-11-18: 0.8 mg via SUBLINGUAL

## 2022-11-23 ENCOUNTER — Telehealth: Payer: Self-pay

## 2022-11-23 DIAGNOSIS — R931 Abnormal findings on diagnostic imaging of heart and coronary circulation: Secondary | ICD-10-CM

## 2022-11-23 MED ORDER — METOPROLOL SUCCINATE ER 25 MG PO TB24: 12.5000 mg | ORAL_TABLET | Freq: Every day | ORAL | 1 refills | Status: DC

## 2022-11-23 NOTE — Telephone Encounter (Addendum)
-----   Message from Carolan Clines E sent at 11/23/2022  8:51 AM EDT ----- Please call Aaron Crane and schedule a LHC. He is planning to discuss with his wife, can wait until later today. We did discuss the procedure. Please also start metoprolol 12.5 mg XL daily The patient understands that risks included but are not limited to stroke (1 in 1000), death (1 in 1000), kidney failure [usually temporary] (1 in 500), bleeding (1 in 200), allergic reaction [possibly serious] (1 in 200).    Spoke with pt regarding abnormal results of coronary CTA. Also spoke with pt's wife, Aaron Crane with questions regarding heart cath procedure. Pt and wife verbalize understanding and are agreeable to move forward with scheduling heart cath. Explained that pt would need to have additional labs drawn for procedure. Orders placed for CBC. Instructions for procedure reviewed. Pt has no additional questions at this time. Advised pt that per Dr. Wyline Mood, pt will start new medication, metoprolol succinate (Toprol-XL) 12.5mg  once daily. Prescription sent to pt's preferred pharmacy.

## 2022-11-25 ENCOUNTER — Telehealth: Payer: Self-pay | Admitting: *Deleted

## 2022-11-25 DIAGNOSIS — G4733 Obstructive sleep apnea (adult) (pediatric): Secondary | ICD-10-CM | POA: Diagnosis not present

## 2022-11-25 NOTE — Telephone Encounter (Signed)
Cardiac Catheterization scheduled at [facility] for: Monday November 29, 2022 7:30 AM Arrival time [facility] Main Entrance A at: 5:30 AM  Nothing to eat after midnight prior to procedure, clear liquids until 5 AM day of procedure.  Medication instructions: -Hold:  Lisinopril-HCT-AM of procedure -Other usual morning medications can be taken with sips of water including aspirin 81 mg.  Plan to go home the same day, you will only stay overnight if medically necessary.  You must have responsible adult to drive you home.  Someone must be with you the first 24 hours after you arrive home.  Reviewed procedure instructions with patient.

## 2022-11-26 DIAGNOSIS — R931 Abnormal findings on diagnostic imaging of heart and coronary circulation: Secondary | ICD-10-CM | POA: Diagnosis not present

## 2022-11-26 LAB — CBC
Hematocrit: 46.5 % (ref 37.5–51.0)
Hemoglobin: 16 g/dL (ref 13.0–17.7)
MCH: 31.8 pg (ref 26.6–33.0)
MCHC: 34.4 g/dL (ref 31.5–35.7)
MCV: 92 fL (ref 79–97)
Platelets: 342 10*3/uL (ref 150–450)
RBC: 5.03 x10E6/uL (ref 4.14–5.80)
RDW: 13.4 % (ref 11.6–15.4)
WBC: 9.6 10*3/uL (ref 3.4–10.8)

## 2022-11-29 ENCOUNTER — Ambulatory Visit (HOSPITAL_COMMUNITY)
Admission: RE | Admit: 2022-11-29 | Discharge: 2022-11-29 | Disposition: A | Discharge status: Home / Self Care | Payer: Medicaid Other | Attending: Internal Medicine | Admitting: Internal Medicine

## 2022-11-29 ENCOUNTER — Encounter (HOSPITAL_COMMUNITY)
Admission: RE | Disposition: A | Discharge status: Home / Self Care | Payer: Self-pay | Source: Home / Self Care | Attending: Internal Medicine

## 2022-11-29 ENCOUNTER — Other Ambulatory Visit: Payer: Self-pay

## 2022-11-29 DIAGNOSIS — E785 Hyperlipidemia, unspecified: Secondary | ICD-10-CM | POA: Diagnosis not present

## 2022-11-29 DIAGNOSIS — I25118 Atherosclerotic heart disease of native coronary artery with other forms of angina pectoris: Secondary | ICD-10-CM | POA: Diagnosis not present

## 2022-11-29 DIAGNOSIS — G4733 Obstructive sleep apnea (adult) (pediatric): Secondary | ICD-10-CM | POA: Diagnosis not present

## 2022-11-29 DIAGNOSIS — Z8673 Personal history of transient ischemic attack (TIA), and cerebral infarction without residual deficits: Secondary | ICD-10-CM | POA: Insufficient documentation

## 2022-11-29 DIAGNOSIS — I2510 Atherosclerotic heart disease of native coronary artery without angina pectoris: Secondary | ICD-10-CM

## 2022-11-29 DIAGNOSIS — Z7982 Long term (current) use of aspirin: Secondary | ICD-10-CM | POA: Diagnosis not present

## 2022-11-29 DIAGNOSIS — G5600 Carpal tunnel syndrome, unspecified upper limb: Secondary | ICD-10-CM | POA: Diagnosis not present

## 2022-11-29 DIAGNOSIS — I10 Essential (primary) hypertension: Secondary | ICD-10-CM | POA: Insufficient documentation

## 2022-11-29 DIAGNOSIS — Z79899 Other long term (current) drug therapy: Secondary | ICD-10-CM | POA: Diagnosis not present

## 2022-11-29 DIAGNOSIS — R931 Abnormal findings on diagnostic imaging of heart and coronary circulation: Secondary | ICD-10-CM | POA: Diagnosis present

## 2022-11-29 DIAGNOSIS — Z87891 Personal history of nicotine dependence: Secondary | ICD-10-CM | POA: Diagnosis not present

## 2022-11-29 HISTORY — PX: LEFT HEART CATH AND CORONARY ANGIOGRAPHY: CATH118249

## 2022-11-29 SURGERY — LEFT HEART CATH AND CORONARY ANGIOGRAPHY
Anesthesia: LOCAL

## 2022-11-29 MED ORDER — SODIUM CHLORIDE 0.9 % WEIGHT BASED INFUSION
3.0000 mL/kg/h | INTRAVENOUS | Status: AC
  Administered 2022-11-29: 3 mL/kg/h via INTRAVENOUS

## 2022-11-29 MED ORDER — VERAPAMIL HCL 2.5 MG/ML IV SOLN
INTRAVENOUS | Status: AC
  Filled 2022-11-29: qty 2

## 2022-11-29 MED ORDER — HEPARIN SODIUM (PORCINE) 1000 UNIT/ML IJ SOLN
INTRAMUSCULAR | Status: DC | PRN
  Administered 2022-11-29: 5000 [IU] via INTRAVENOUS

## 2022-11-29 MED ORDER — VERAPAMIL HCL 2.5 MG/ML IV SOLN
INTRAVENOUS | Status: DC | PRN
  Administered 2022-11-29: 10 mL via INTRA_ARTERIAL

## 2022-11-29 MED ORDER — HYDRALAZINE HCL 20 MG/ML IJ SOLN: 10.0000 mg | INTRAMUSCULAR | Status: DC | PRN

## 2022-11-29 MED ORDER — ASPIRIN 81 MG PO CHEW
81.0000 mg | CHEWABLE_TABLET | ORAL | Status: AC
  Administered 2022-11-29: 81 mg via ORAL
  Filled 2022-11-29: qty 1

## 2022-11-29 MED ORDER — LIDOCAINE HCL (PF) 1 % IJ SOLN
INTRAMUSCULAR | Status: DC | PRN
  Administered 2022-11-29: 2 mL via INTRADERMAL

## 2022-11-29 MED ORDER — ONDANSETRON HCL 4 MG/2ML IJ SOLN: 4.0000 mg | Freq: Four times a day (QID) | INTRAMUSCULAR | Status: DC | PRN

## 2022-11-29 MED ORDER — HEPARIN (PORCINE) IN NACL 1000-0.9 UT/500ML-% IV SOLN
INTRAVENOUS | Status: DC | PRN
  Administered 2022-11-29 (×2): 500 mL

## 2022-11-29 MED ORDER — SODIUM CHLORIDE 0.9 % WEIGHT BASED INFUSION: 1.0000 mL/kg/h | INTRAVENOUS | Status: DC

## 2022-11-29 MED ORDER — LABETALOL HCL 5 MG/ML IV SOLN: 10.0000 mg | INTRAVENOUS | Status: DC | PRN

## 2022-11-29 MED ORDER — MIDAZOLAM HCL 2 MG/2ML IJ SOLN
INTRAMUSCULAR | Status: DC | PRN
  Administered 2022-11-29: 1 mg via INTRAVENOUS

## 2022-11-29 MED ORDER — LIDOCAINE HCL (PF) 1 % IJ SOLN
INTRAMUSCULAR | Status: AC
  Filled 2022-11-29: qty 30

## 2022-11-29 MED ORDER — SODIUM CHLORIDE 0.9% FLUSH: 3.0000 mL | INTRAVENOUS | Status: DC | PRN

## 2022-11-29 MED ORDER — METOPROLOL SUCCINATE ER 25 MG PO TB24: 25.0000 mg | ORAL_TABLET | Freq: Every day | ORAL | 3 refills | Status: DC

## 2022-11-29 MED ORDER — SODIUM CHLORIDE 0.9 % IV SOLN: INTRAVENOUS | Status: DC

## 2022-11-29 MED ORDER — SODIUM CHLORIDE 0.9% FLUSH: 3.0000 mL | Freq: Two times a day (BID) | INTRAVENOUS | Status: DC

## 2022-11-29 MED ORDER — SODIUM CHLORIDE 0.9 % IV SOLN: 250.0000 mL | INTRAVENOUS | Status: DC | PRN

## 2022-11-29 MED ORDER — FENTANYL CITRATE (PF) 100 MCG/2ML IJ SOLN
INTRAMUSCULAR | Status: DC | PRN
  Administered 2022-11-29: 25 ug via INTRAVENOUS

## 2022-11-29 MED ORDER — NITROGLYCERIN 0.4 MG SL SUBL: 0.4000 mg | SUBLINGUAL_TABLET | SUBLINGUAL | 99 refills | Status: DC | PRN

## 2022-11-29 MED ORDER — PRASUGREL HCL 10 MG PO TABS
ORAL_TABLET | ORAL | Status: AC
  Filled 2022-11-29: qty 1

## 2022-11-29 MED ORDER — HEPARIN SODIUM (PORCINE) 1000 UNIT/ML IJ SOLN
INTRAMUSCULAR | Status: AC
  Filled 2022-11-29: qty 10

## 2022-11-29 MED ORDER — IOHEXOL 350 MG/ML SOLN
INTRAVENOUS | Status: DC | PRN
  Administered 2022-11-29: 30 mL

## 2022-11-29 MED ORDER — MIDAZOLAM HCL 2 MG/2ML IJ SOLN
INTRAMUSCULAR | Status: AC
  Filled 2022-11-29: qty 2

## 2022-11-29 MED ORDER — ACETAMINOPHEN 325 MG PO TABS: 650.0000 mg | ORAL_TABLET | ORAL | Status: DC | PRN

## 2022-11-29 MED ORDER — FENTANYL CITRATE (PF) 100 MCG/2ML IJ SOLN
INTRAMUSCULAR | Status: AC
  Filled 2022-11-29: qty 2

## 2022-11-29 SURGICAL SUPPLY — 9 items
CATH 5FR JL3.5 JR4 ANG PIG MP (CATHETERS) IMPLANT
DEVICE RAD COMP TR BAND LRG (VASCULAR PRODUCTS) IMPLANT
GLIDESHEATH SLEND SS 6F .021 (SHEATH) IMPLANT
GUIDEWIRE INQWIRE 1.5J.035X260 (WIRE) IMPLANT
INQWIRE 1.5J .035X260CM (WIRE) ×1
KIT SYRINGE INJ CVI SPIKEX1 (MISCELLANEOUS) IMPLANT
PACK CARDIAC CATHETERIZATION (CUSTOM PROCEDURE TRAY) ×1 IMPLANT
SET ATX-X65L (MISCELLANEOUS) IMPLANT
SHEATH PROBE COVER 6X72 (BAG) IMPLANT

## 2022-11-29 NOTE — Interval H&P Note (Signed)
History and Physical Interval Note:  11/29/2022 9:43 AM  Kandra Nicolas  has presented today for surgery, with the diagnosis of chest pain and abnormal cardiac CTA.  The various methods of treatment have been discussed with the patient and family. After consideration of risks, benefits and other options for treatment, the patient has consented to  Procedure(s): LEFT HEART CATH AND CORONARY ANGIOGRAPHY (N/A) as a surgical intervention.  The patient's history has been reviewed, patient examined, no change in status, stable for surgery.  I have reviewed the patient's chart and labs.  Questions were answered to the patient's satisfaction.    Cath Lab Visit (complete for each Cath Lab visit)  Clinical Evaluation Leading to the Procedure:   ACS: No.  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

## 2022-11-29 NOTE — Progress Notes (Signed)
Patient and wife was giving discharge instructions. Both verbalized understanding.

## 2022-11-29 NOTE — Discharge Instructions (Signed)

## 2022-11-30 ENCOUNTER — Encounter (HOSPITAL_COMMUNITY): Payer: Self-pay | Admitting: Internal Medicine

## 2022-11-30 DIAGNOSIS — G4733 Obstructive sleep apnea (adult) (pediatric): Secondary | ICD-10-CM | POA: Diagnosis not present

## 2022-12-09 ENCOUNTER — Encounter: Payer: Self-pay | Admitting: Neurology

## 2022-12-09 NOTE — Progress Notes (Deleted)
  Cardiology Office Note:  .   Date:  12/09/2022  ID:  Kandra Nicolas, DOB Jun 10, 1970, MRN [id number] PCP: Delfino Lovett, FNP  [facility] Providers Cardiologist:  Jodelle Red, MD    History of Present Illness: .   Aaron Crane is a 52 y.o. male with past medical history of CAD, hypertension, hyperlipidemia, OSA on CPAP, history of CVA.  Patient is followed by Dr. Cristal Deer and presents today for follow-up after cardiac catheterization.  Patient was seen in the clinic on 9/12 for evaluation of chest pain.  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  Study was sent for FFR which was positive in anomalous LCX,  particularly the OM. He underwent cardiac catheterization 11/29/22 that showed moderate to severe multivessel coronary artery disease predominantly affecting small/distal branches. 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 Recommended increasing antianginal therapy with increase in metoprolol succinate to 25 mg daily. Of note, if patient has refractory angina despite anti-anginal therapy, could consider PCI to mid/distal Lcx and distal LAD.   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 on 9/12  HLD   - Most recent lipid panel from 01/2021 showed LDL 100  - Update lipid panel  - Continue lipitor 40 mg daily   OSA on CPAP  - Encouraged compliance with CPAP   ROS: ***  Studies Reviewed: .        *** Risk Assessment/Calculations:   {Does this patient have ATRIAL FIBRILLATION?:[id number]} No BP recorded.  {Refresh Note OR Click here to enter BP  :1}***       Physical Exam:   VS:  There were no vitals taken for this visit.   Wt Readings from Last 3 Encounters:  11/29/22 273 lb (123.8 kg)  11/11/22 274 lb (124.3 kg)  10/21/22 276 lb (125.2 kg)    GEN: Well nourished, well developed in no acute distress NECK: No JVD; No carotid bruits CARDIAC: ***RRR, no murmurs, rubs, gallops RESPIRATORY:  Clear to auscultation without rales, wheezing or rhonchi  ABDOMEN: Soft, non-tender, non-distended EXTREMITIES:  No edema; No deformity   ASSESSMENT AND PLAN: .   ***    {Are you ordering a CV Procedure (e.g. stress test, cath, DCCV, TEE, etc)?   Press F2        :[id number]}  Dispo: ***  Signed, Jonita Albee, PA-C

## 2022-12-16 ENCOUNTER — Ambulatory Visit: Payer: Medicaid Other | Attending: Cardiology | Admitting: Cardiology

## 2022-12-23 DIAGNOSIS — G4733 Obstructive sleep apnea (adult) (pediatric): Secondary | ICD-10-CM | POA: Diagnosis not present

## 2022-12-31 DIAGNOSIS — G4733 Obstructive sleep apnea (adult) (pediatric): Secondary | ICD-10-CM | POA: Diagnosis not present

## 2023-01-17 ENCOUNTER — Ambulatory Visit: Payer: Medicaid Other | Attending: Cardiology | Admitting: Emergency Medicine

## 2023-01-17 ENCOUNTER — Encounter: Payer: Self-pay | Admitting: Physician Assistant

## 2023-01-17 VITALS — BP 110/66 | HR 64 | Ht 71.0 in | Wt 273.0 lb

## 2023-01-17 DIAGNOSIS — I10 Essential (primary) hypertension: Secondary | ICD-10-CM

## 2023-01-17 DIAGNOSIS — E782 Mixed hyperlipidemia: Secondary | ICD-10-CM | POA: Diagnosis not present

## 2023-01-17 DIAGNOSIS — I2510 Atherosclerotic heart disease of native coronary artery without angina pectoris: Secondary | ICD-10-CM

## 2023-01-17 DIAGNOSIS — Z79899 Other long term (current) drug therapy: Secondary | ICD-10-CM | POA: Diagnosis not present

## 2023-01-17 DIAGNOSIS — G4733 Obstructive sleep apnea (adult) (pediatric): Secondary | ICD-10-CM

## 2023-01-17 LAB — BASIC METABOLIC PANEL
BUN/Creatinine Ratio: 12 (ref 9–20)
BUN: 11 mg/dL (ref 6–24)
CO2: 27 mmol/L (ref 20–29)
Calcium: 10.4 mg/dL — ABNORMAL HIGH (ref 8.7–10.2)
Chloride: 99 mmol/L (ref 96–106)
Creatinine, Ser: 0.9 mg/dL (ref 0.76–1.27)
Glucose: 109 mg/dL — ABNORMAL HIGH (ref 70–99)
Potassium: 4.2 mmol/L (ref 3.5–5.2)
Sodium: 137 mmol/L (ref 134–144)
eGFR: 104 mL/min/{1.73_m2} (ref >59)

## 2023-01-17 LAB — LIPID PANEL
Chol/HDL Ratio: 3.3 ratio (ref 0.0–5.0)
Cholesterol, Total: 155 mg/dL (ref 100–199)
HDL: 47 mg/dL (ref >39)
LDL Chol Calc (NIH): 81 mg/dL (ref 0–99)
Triglycerides: 157 mg/dL — ABNORMAL HIGH (ref 0–149)
VLDL Cholesterol Cal: 27 mg/dL (ref 5–40)

## 2023-01-17 MED ORDER — METOPROLOL SUCCINATE ER 25 MG PO TB24: 25.0000 mg | ORAL_TABLET | Freq: Every day | ORAL | 3 refills | Status: AC

## 2023-01-17 NOTE — Progress Notes (Signed)
Cardiology Office Note:    Date:  01/17/2023  ID:  Aaron Crane, DOB 11/11/1970, MRN [id number] PCP: Delfino Lovett, FNP  [facility] Providers Cardiologist:  Jodelle Red, MD       Patient Profile:      52 year old male with history of CAD, hypertension, hyperlipidemia, OSA on CPAP, CVA.    He presented to our office on 11/11/2022 post ED visit for left-sided chest pain and neck pain.  In clinic he noted intermittent chest pain and tingling sensation in his arm.  Pain occurred at rest and during exertion.  EKG showed minor T wave changes inferiorly. Coronary CTA ordered showing calcium score of 39.5 which is 89 percentile.  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   Left heart cath ordered and completed on 11/29/2022.  LHC showed moderate to severe multivessel CAD predominantly affected small/distal branches, including 70-80% D2 distal LAD lesions, and sequential 50-70% proximal to distal LCx stenosis.  He was recommended to escalate antianginal therapy with initiation of metoprolol succinate 25mg .        History of Present Illness:   Aaron Crane is a 52 y.o. male who returns for follow-up office visit coronary catheterization.  Today, patient notes that he is doing well.  He reports since starting his metoprolol his chest pain significantly improved.  He notes 1 episode of exertional chest pain since his catheterization, where he overexerted himself at work.  He took 1 nitroglycerin which improvement in CP.  He denies any other episodes of chest pain.  He tells me that he has made significant lifestyle changes, he is currently reduced his red meat and fried food consumption.  He notes that he stopped smoking cigarettes 6 months ago, he was smoking 1 pack/day.  However he does vape on occasion.  He has been losing weight.           Review of Systems  Constitutional: Negative for weight gain and weight loss.   Cardiovascular:  Negative for chest pain, claudication, cyanosis, dyspnea on exertion, irregular heartbeat, leg swelling, near-syncope, orthopnea, palpitations, paroxysmal nocturnal dyspnea and syncope.  Respiratory:  Negative for cough, hemoptysis and shortness of breath.   Gastrointestinal:  Negative for abdominal pain, hematochezia and melena.  Genitourinary:  Negative for hematuria.     See HPI    Studies Reviewed:       LHC 11/29/2022 Conclusions: Moderate to severe multivessel coronary artery disease predominantly affected small/distal branchs, including 70-80% D2 and distal LAD lesions, and sequential 50-70% proximal through distal LCx stenoses. 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 Normal left ventricular systolic function (LVEF 55-65%) and filling pressure (LVEDP 15 mmHg). Recommendations: Escalate antianginal therapy with increase in metoprolol succinate to 25 mg daily. Aggressive secondary prevention of coronary artery disease. If the patient has refractor angina despite being on maximally tolerated doses of two antianginal agents, PCI to mid/distal LCx +/- distal LAD could be considered. Diagnostic Dominance: Right   Risk Assessment/Calculations:             Physical Exam:   VS:  BP 110/66 (BP Location: Right Arm, Patient Position: Sitting, Cuff Size: Large)   Pulse 64   Ht 5\' 11"  (1.803 m)   Wt 273 lb (123.8 kg)   BMI 38.08 kg/m    Wt Readings from Last 3 Encounters:  01/17/23 273 lb (123.8 kg)  11/29/22 273 lb (123.8  kg)  11/11/22 274 lb (124.3 kg)    Constitutional:      Appearance: Normal and healthy appearance.  Neck:     Vascular: JVD normal.  Pulmonary:     Effort: Pulmonary effort is normal.     Breath sounds: Normal breath sounds.  Chest:     Chest wall: Not tender to palpatation.  Cardiovascular:     PMI at left midclavicular line. Normal rate. Regular rhythm. Normal S1.  Normal S2.      Murmurs: There is no murmur.     No gallop.  No click. No rub.  Pulses:    Intact distal pulses.  Edema:    Peripheral edema absent.  Musculoskeletal: Normal range of motion.     Cervical back: Normal range of motion and neck supple. Skin:    General: Skin is warm and dry.  Neurological:     General: No focal deficit present.     Mental Status: Alert and oriented to person, place and time.  Psychiatric:        Behavior: Behavior is cooperative.        Assessment and Plan:  Coronary artery disease -LHC 11/29/2022: 70-80% D2 and distal LAD lesions, and sequential 50-70% proximal through distal LCx stenoses -CP free today, no SOB -x1 episode of exertional chest pain, relieved with x1 nitro, pain significantly improved with addition of BB -Continue metoprolol-XL 25 mg once daily, ASA 81 mg once daily, atorvastatin 40 mg once daily, as needed nitroglycerin -Encouraged heart healthy dieting, increase vegetables and fruits, decrease fried foods -If CP becomes recurrent or worsens plan would be to add Imdur   Hypertension -BP today 110/66 -Controlled continue lisinopril-hydrochlorothiazide 20-25mg  -Continue taking blood pressure at home  Hyperlipidemia, goal less than 70 -LDL 01/2021 100 -Fasting lipid panel today, can increase atorvastatin if needed to get to goal -Continue atorvastatin 40 mg daily -Encouraged physical exercise, decreasing sodium in diet  OSA -Encouraged continued compliance with CPAP                 Dispo:  Return in about 6 months (around 07/17/2023).  Signed, Denyce Robert, NP

## 2023-01-17 NOTE — Patient Instructions (Signed)
Medication Instructions:  NO CHANGES  *If you need a refill on your cardiac medications before your next appointment, please call your pharmacy*   Lab Work: LIPID PANEL AND BMP TODAY.  If you have labs (blood work) drawn today and your tests are completely normal, you will receive your results only by: MyChart Message (if you have MyChart) OR A paper copy in the mail If you have any lab test that is abnormal or we need to change your treatment, we will call you to review the results.   Testing/Procedures: NO TESTING   Follow-Up: At [facility], you and your health needs are our priority.  As part of our continuing mission to provide you with exceptional heart care, we have created designated Provider Care Teams.  These Care Teams include your primary Cardiologist (physician) and Advanced Practice Providers (APPs -  Physician Assistants and Nurse Practitioners) who all work together to provide you with the care you need, when you need it.   Your next appointment:   6 month(s)  Provider:   Azalee Course, PA or Rise Paganini NP    Other Instructions PROVIDER RECOMMENDS SALTY SIX DIET.

## 2023-01-21 DIAGNOSIS — G4733 Obstructive sleep apnea (adult) (pediatric): Secondary | ICD-10-CM | POA: Diagnosis not present

## 2023-01-30 DIAGNOSIS — G4733 Obstructive sleep apnea (adult) (pediatric): Secondary | ICD-10-CM | POA: Diagnosis not present

## 2023-02-04 ENCOUNTER — Other Ambulatory Visit: Payer: Self-pay

## 2023-02-04 DIAGNOSIS — Z79899 Other long term (current) drug therapy: Secondary | ICD-10-CM

## 2023-02-04 MED ORDER — ATORVASTATIN CALCIUM 80 MG PO TABS: 80.0000 mg | ORAL_TABLET | Freq: Every day | ORAL | 3 refills | Status: AC

## 2023-02-22 DIAGNOSIS — G4733 Obstructive sleep apnea (adult) (pediatric): Secondary | ICD-10-CM | POA: Diagnosis not present

## 2023-03-02 DIAGNOSIS — G4733 Obstructive sleep apnea (adult) (pediatric): Secondary | ICD-10-CM | POA: Diagnosis not present

## 2023-03-22 DIAGNOSIS — I10 Essential (primary) hypertension: Secondary | ICD-10-CM | POA: Diagnosis not present

## 2023-03-22 DIAGNOSIS — E785 Hyperlipidemia, unspecified: Secondary | ICD-10-CM | POA: Diagnosis not present

## 2023-03-22 DIAGNOSIS — Z0000 Encounter for general adult medical examination without abnormal findings: Secondary | ICD-10-CM | POA: Diagnosis not present

## 2023-03-22 DIAGNOSIS — R7303 Prediabetes: Secondary | ICD-10-CM | POA: Diagnosis not present

## 2023-03-29 DIAGNOSIS — G4733 Obstructive sleep apnea (adult) (pediatric): Secondary | ICD-10-CM | POA: Diagnosis not present

## 2023-04-02 DIAGNOSIS — G4733 Obstructive sleep apnea (adult) (pediatric): Secondary | ICD-10-CM | POA: Diagnosis not present

## 2023-04-20 NOTE — Progress Notes (Unsigned)
PATIENT: Aaron Crane DOB: [birth year]  REASON FOR VISIT: follow up HISTORY FROM: patient  Virtual Visit via Telephone Note  I connected with Aaron Crane on 04/21/23 at  9:30 AM EST by telephone and verified that I am speaking with the correct person using two identifiers.   I discussed the limitations, risks, security and privacy concerns of performing an evaluation and management service by telephone and the availability of in person appointments. I also discussed with the patient that there may be a patient responsible charge related to this service. The patient expressed understanding and agreed to proceed.   History of Present Illness:  04/21/23 ALL (Mychart): Aaron Crane is a 53 y.o. male here today for follow up for OSA on CPAP. He was seen last 09/2022 and was adjusting to therapy. Since, he reports doing well. He is tolerating therapy well. No concerns with machine or supplies. He was sick with the flu in 03/2023 which disrupted usage for about a week but he is back on track now. He does not always get 4 hours of sleep.     10/21/22 ALL:  Aaron Crane is a 53 y.o. male here today for follow up for OSA on CPAP. He was seen in consult with Dr Frances Furbish 05/2022 for history of OSA but not on CPAP therapy. HST 06/2022 showed "moderate obstructive sleep apnea with a total AHI of 24.9/hour and O2 nadir of 76% with significant time below or at 88% saturation of 10 minutes for the night, indicating nocturnal hypoxemia." AutoPAP advised. Since, he reports doing well. He continues to adjust to therapy. He is using therapy most nights for about 4-5 hours. He usually gets about 4-6 hours of sleep each night. He works in [employer] during the day. 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 He admits that he often falls asleep before starting therapy. He is using a dreamwear style FFM. He denies concerns with machine or supplies. He does not improvement in sleep quality. He feels ready to start the day  when he wakes. He is trying to lose weight. He has lost about 5lbs. He has changed diet.     HISTORY: (copied from Dr Teofilo Pod previous note)  Dear Aaron Crane,    I saw your patient, Aaron Crane, upon your kind request in my sleep clinic today for initial consultation of his sleep disorder, in particular, evaluation of his prior diagnosis of obstructive sleep apnea. The patient is unaccompanied today.  As you know, Aaron Crane is a 53 year old male with an underlying medical history of hypertension, hyperlipidemia, recurrent headaches, history of stroke, and obesity, who was previously diagnosed with obstructive sleep apnea and placed on PAP therapy.  His Epworth sleepiness score is 9/24, fatigue severity score is 22/63. Prior sleep study results are not available for my review today.  Testing was in [place], [place] about 5 or 6 years ago.  As he recalls, he did a home sleep test.  He was given a PAP machine but never had a follow-up.  He has an older model, he has struggled with it because he cannot get replacement supplies, it does not have a humidifier and he has had worsening congestion and has trouble tolerating the cold air.  He was using nasal pillows but admits that he has significant nasal congestion.  He denies taking a nasal decongestion but has tried a saline rinse system before.  His wife is worried about his sleep as he has gasping sounds and pauses in his breathing and loud  snoring.  He has nocturia about once per average night and has occasionally woken up with a headache.  He has not used his machine in about 6 months or so, it also turns off randomly at times.  I reviewed your office note from 03/18/2022.  He had blood work through your office at the time including CMP, CBC with differential, lipid panel, and HIV test as well as chlamydia test, results were benign. His bedtime is generally around 10 PM, as late as midnight.  Rise time is 6:15 AM but he is often awake weight before and  has trouble maintaining sleep.  He does not wake up rested.  He has gained weight since his previous sleep study but is currently working on weight loss and thus far has lost about 10 pounds.  He quit smoking some 2 to 3 weeks ago.  He does not drink caffeine daily, he drinks alcohol very occasionally or rarely.  He lives with his wife, no pets in the household, kids are grown.  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  He works in residential remodeling.  He has been sleepy at the wheel before, he admits to having trouble with sleepiness when he was driving to [place].  His wife does not let him drive any longer distances anymore   Observations/Objective:  Generalized: Well developed, in no acute distress  Mentation: Alert oriented to time, place, history taking. Follows all commands speech and language fluent   Assessment and Plan:  53 y.o. year old male  has a past medical history of Headache, Hypercholesteremia, Hypertension, Sleep apnea, and Stroke (HCC) (2014). here with    ICD-10-CM   1. OSA on CPAP  G47.33 For home use only DME continuous positive airway pressure (CPAP)     Axtyn reports doing well on CPAP. Compliance report shows optimal daily but sub optimal four hour usage. No obvious concerns. He was encouraged to use CPAP nightly for at least four hours. Supply orders updated. He will follow up with me in 1 year.    Orders Placed This Encounter  Procedures   For home use only DME continuous positive airway pressure (CPAP)    Heated Humidity with all supplies as needed    Length of Need:   Lifetime    Patient has OSA or probable OSA:   Yes    Is the patient currently using CPAP in the home:   Yes    Settings:   Other see comments    CPAP supplies needed:   Mask, headgear, cushions, filters, heated tubing and water chamber    No orders of the defined types were placed in this encounter.    Follow Up Instructions:  I  discussed the assessment and treatment plan with the patient. The patient was provided an opportunity to ask questions and all were answered. The patient agreed with the plan and demonstrated an understanding of the instructions.   The patient was advised to call back or seek an in-person evaluation if the symptoms worsen or if the condition fails to improve as anticipated.  I provided 15 minutes of non-face-to-face time during this encounter. Patient located at their place of residence during Mychart visit. Provider is in the office.    Shawnie Dapper, NP

## 2023-04-20 NOTE — Patient Instructions (Signed)

## 2023-04-21 ENCOUNTER — Telehealth: Payer: Medicaid Other | Admitting: Family Medicine

## 2023-04-21 ENCOUNTER — Encounter: Payer: Self-pay | Admitting: Family Medicine

## 2023-04-21 DIAGNOSIS — G4733 Obstructive sleep apnea (adult) (pediatric): Secondary | ICD-10-CM | POA: Diagnosis not present

## 2023-04-21 NOTE — Progress Notes (Signed)
 Community message sent to [facility] that CPAP supplies order placed.

## 2023-04-27 DIAGNOSIS — G4733 Obstructive sleep apnea (adult) (pediatric): Secondary | ICD-10-CM | POA: Diagnosis not present

## 2023-04-30 DIAGNOSIS — G4733 Obstructive sleep apnea (adult) (pediatric): Secondary | ICD-10-CM | POA: Diagnosis not present

## 2023-05-27 DIAGNOSIS — G4733 Obstructive sleep apnea (adult) (pediatric): Secondary | ICD-10-CM | POA: Diagnosis not present

## 2023-07-01 DIAGNOSIS — G4733 Obstructive sleep apnea (adult) (pediatric): Secondary | ICD-10-CM | POA: Diagnosis not present

## 2023-08-23 DIAGNOSIS — G4733 Obstructive sleep apnea (adult) (pediatric): Secondary | ICD-10-CM | POA: Diagnosis not present

## 2023-09-27 DIAGNOSIS — G4733 Obstructive sleep apnea (adult) (pediatric): Secondary | ICD-10-CM | POA: Diagnosis not present

## 2023-10-25 DIAGNOSIS — G4733 Obstructive sleep apnea (adult) (pediatric): Secondary | ICD-10-CM | POA: Diagnosis not present

## 2023-11-01 ENCOUNTER — Encounter: Payer: Self-pay | Admitting: *Deleted

## 2023-11-03 ENCOUNTER — Encounter: Payer: Self-pay | Admitting: Emergency Medicine

## 2023-11-03 ENCOUNTER — Ambulatory Visit: Attending: Emergency Medicine | Admitting: Emergency Medicine

## 2023-11-03 VITALS — BP 130/80 | HR 85 | Ht 71.0 in | Wt 276.0 lb

## 2023-11-03 DIAGNOSIS — I25118 Atherosclerotic heart disease of native coronary artery with other forms of angina pectoris: Secondary | ICD-10-CM | POA: Diagnosis not present

## 2023-11-03 DIAGNOSIS — R0789 Other chest pain: Secondary | ICD-10-CM | POA: Diagnosis not present

## 2023-11-03 DIAGNOSIS — I10 Essential (primary) hypertension: Secondary | ICD-10-CM | POA: Diagnosis not present

## 2023-11-03 DIAGNOSIS — I2510 Atherosclerotic heart disease of native coronary artery without angina pectoris: Secondary | ICD-10-CM | POA: Diagnosis not present

## 2023-11-03 DIAGNOSIS — G4733 Obstructive sleep apnea (adult) (pediatric): Secondary | ICD-10-CM | POA: Insufficient documentation

## 2023-11-03 DIAGNOSIS — Z79899 Other long term (current) drug therapy: Secondary | ICD-10-CM | POA: Diagnosis not present

## 2023-11-03 DIAGNOSIS — E785 Hyperlipidemia, unspecified: Secondary | ICD-10-CM | POA: Insufficient documentation

## 2023-11-03 LAB — LIPID PANEL

## 2023-11-03 MED ORDER — AMLODIPINE BESYLATE 5 MG PO TABS: 5.0000 mg | ORAL_TABLET | Freq: Every day | ORAL | 3 refills | Status: AC

## 2023-11-03 NOTE — Patient Instructions (Signed)
 Medication Instructions:  START TAKING AMLODIPINE  5 MG DAILY.   Lab Work: BMET, CBC, AND FASTING LIPID PANEL TO BE DONE TODAY.   Testing/Procedures: NONE  Follow-Up: At [facility], you and your health needs are our priority.  As part of our continuing mission to provide you with exceptional heart care, our providers are all part of one team.  This team includes your primary Cardiologist (physician) and Advanced Practice Providers or APPs (Physician Assistants and Nurse Practitioners) who all work together to provide you with the care you need, when you need it.  Your next appointment:   3-4 MONTHS  Provider:   Shelda Bruckner, MD OR Lum Louis, NP

## 2023-11-03 NOTE — Progress Notes (Signed)
 Cardiology Office Note:    Date:  11/03/2023  ID:  Aaron Crane, DOB [birth year], MRN [id number] PCP: Omar Cumins, FNP  [facility] Providers Cardiologist:  Shelda Bruckner, MD Cardiology APP:  Rana Lum CROME, NP       Patient Profile:       Chief Complaint: 68-month follow-up History of Present Illness:  Aaron Crane is a 53 y.o. male with visit-pertinent history of CAD, hypertension, hyperlipidemia, OSA on CPAP, history of reported CVA in 1 in [place]   Exercise tolerance test 04/2019 was abnormal due to development of chest discomfort and ST segment changes.  Did not have insurance that time I was working to get assistant for CT coverage for further testing.  He was lost to follow-up.  He presented to our office on 11/11/2022 post ED visit for left-sided chest pain and neck pain.  In clinic he noted intermittent chest pain and tingling sensation in his arm.  Pain occurred at rest and during exertion.  EKG showed minor T wave changes inferiorly. Coronary CTA ordered showing calcium  score of 39.5 which is 89 percentile.  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    Left heart cath ordered and completed on 11/29/2022.  LHC showed moderate to severe multivessel CAD predominantly affected small/distal branches, including 70-80% D2 and distal LAD lesions, and sequential 50-70% proximal through distal LCx stenoses.  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  Normal LVEF 55-65% and normal LVEDP at 15 mmHg.  He was recommended to escalate antianginal therapy with initiation of metoprolol  succinate 25mg .  It was noted the patient has refractory angina despite being on maximally tolerated doses of 2 antianginal agents, PCI of the mid/distal Lcx +/- distal LAD could be considered  He is seen for follow-up on 01/17/2023 for follow-up after catheterization.  He was without  any chest pains or dyspnea.  He was feeling much improved.  His medication management was continued.  He is to follow-up in 6 months.   Discussed the use of AI scribe software for clinical note transcription with the patient, who gave verbal consent to proceed.  History of Present Illness Aaron Crane is a 53 year old male with coronary artery disease who presents for a six-month follow-up.  Today's patient tells me he is doing well overall.  No new or acute complaints  Chest pain has improved significantly since the his catheterization. Current pain is mild, described as a 'little gas bubble', stable, and predictable. Nitroglycerin  use is minimal, with only two doses since the last visit. No radiation to the left arm, lightheadedness, dizziness, syncope, presyncope, orthopnea, PND, palpitations, or shortness of breath..  He has made lifestyle changes following a stroke, including reducing alcohol, quitting smoking, and dietary adjustments. He remains physically active as a Surveyor, minerals, managing activity levels. No significant chest pain during work since workload adjustments.  He uses a CPAP machine for sleep apnea.  Blood pressure is stable at 130/80 mmHg. Weight is stable, fluctuating around 276 pounds.  Review of systems:  Please see the history of present illness. All other systems are reviewed and otherwise negative.      Studies Reviewed:    EKG Interpretation Date/Time:  Thursday November 03 2023 10:05:30 EDT Ventricular Rate:  85 PR Interval:  128 QRS Duration:  78 QT Interval:  350 QTC Calculation: 416 R Axis:   43  Text Interpretation:  Sinus rhythm with occasional Premature ventricular complexes Nonspecific T wave abnormality When compared with ECG of 11-Nov-2022 08:23, Premature ventricular complexes are now Present Confirmed by Rana Dixon [phone]) on 11/03/2023 12:18:01 PM    Cardiac catheterization 11/29/2022 Conclusions: Moderate to severe multivessel coronary  artery disease predominantly affected small/distal branchs, including 70-80% D2 and distal LAD lesions, and sequential 50-70% proximal through distal LCx stenoses. 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 Normal left ventricular systolic function (LVEF 55-65%) and filling pressure (LVEDP 15 mmHg). Recommendations: Escalate antianginal therapy with increase in metoprolol  succinate to 25 mg daily. Aggressive secondary prevention of coronary artery disease. If the patient has refractor angina despite being on maximally tolerated doses of two antianginal agents, PCI to mid/distal LCx +/- distal LAD could be considered. Diagnostic Dominance: Right   Risk Assessment/Calculations:              Physical Exam:   VS:  BP 130/80 (BP Location: Left Arm, Patient Position: Sitting, Cuff Size: Normal)   Pulse 85   Ht 5' 11 (1.803 m)   Wt 276 lb (125.2 kg)   BMI 38.49 kg/m    Wt Readings from Last 3 Encounters:  11/03/23 276 lb (125.2 kg)  01/17/23 273 lb (123.8 kg)  11/29/22 273 lb (123.8 kg)    GEN: Well nourished, well developed in no acute distress NECK: No JVD; No carotid bruits CARDIAC: RRR, no murmurs, rubs, gallops RESPIRATORY:  Clear to auscultation without rales, wheezing or rhonchi  ABDOMEN: Soft, non-tender, non-distended EXTREMITIES:  No edema; No acute deformity      Assessment and Plan:  Coronary artery disease LHC 10/2022 with severe multivessel disease predominantly affected small/distal branches, including 70-80% D2 and distal LAD lesions, and sequential 50-70% proximal through distal LCx stenoses - Per cath report, if patient has refractory angina despite being on maximally tolerated doses of 2 antianginal agents, PCI to mid/distal Lcx +/- distal LAD could be considered - EKG today without new ischemic acute changes  - Today he describes chronic stable angina.  Denies prior anginal equivalent.  Reports  current chest pains are mild, described as a gas bubble that are stable and predictable without associated symptoms.  Has active job as Surveyor, minerals without exertional symptoms or limitations - Plan to start amlodipine  5 mg daily for further escalate antianginal therapy - Continue metoprolol  XL 25 mg daily, aspirin  81 mg daily, atorvastatin  80 mg daily, and as needed nitroglycerin  - CBC and BMET today   Hypertension Blood pressure today is 130/80 and well-controlled - Continue lisinopril-hydrochlorothiazide 20-25 mg daily - Start amlodipine  5 mg daily as noted above - Continue home BP monitoring   Hyperlipidemia, LDL goal < 70 LDL 81 on 12/2022 Atorvastatin  increased to 40 to 80 mg daily on 12/2022 - Plan for fasting lipid panel today  - Continue atorvastatin  80 mg daily - Heart healthy dieting   OSA - Remains adherent to CPAP  History of stroke Occurred in 2015 while living in [place] Denies new neurological symptoms - On secondary prevention with aspirin  81 mg daily and atorvastatin  80 mg daily      Dispo:  Return in about 4 months (around 03/04/2024).  Signed, Dixon LITTIE Rana, NP

## 2023-11-04 ENCOUNTER — Ambulatory Visit: Payer: Self-pay | Admitting: Emergency Medicine

## 2023-11-04 ENCOUNTER — Telehealth: Payer: Self-pay | Admitting: Emergency Medicine

## 2023-11-04 DIAGNOSIS — E785 Hyperlipidemia, unspecified: Secondary | ICD-10-CM

## 2023-11-04 LAB — LIPID PANEL
Cholesterol, Total: 230 mg/dL — AB (ref 100–199)
HDL: 45 mg/dL (ref >39)
LDL CALC COMMENT:: 5.1 ratio — AB (ref 0.0–5.0)
LDL Chol Calc (NIH): 145 mg/dL — AB (ref 0–99)
Triglycerides: 220 mg/dL — AB (ref 0–149)
VLDL Cholesterol Cal: 40 mg/dL (ref 5–40)

## 2023-11-04 LAB — BASIC METABOLIC PANEL WITH GFR
BUN/Creatinine Ratio: 15 (ref 9–20)
BUN: 15 mg/dL (ref 6–24)
CO2: 24 mmol/L (ref 20–29)
Calcium: 9.8 mg/dL (ref 8.7–10.2)
Chloride: 99 mmol/L (ref 96–106)
Creatinine, Ser: 0.97 mg/dL (ref 0.76–1.27)
Glucose: 97 mg/dL (ref 70–99)
Potassium: 4.2 mmol/L (ref 3.5–5.2)
Sodium: 138 mmol/L (ref 134–144)
eGFR: 95 mL/min/1.73 (ref >59)

## 2023-11-04 LAB — CBC
Hematocrit: 44.9 % (ref 37.5–51.0)
Hemoglobin: 15.1 g/dL (ref 13.0–17.7)
MCH: 31.3 pg (ref 26.6–33.0)
MCHC: 33.6 g/dL (ref 31.5–35.7)
MCV: 93 fL (ref 79–97)
Platelets: 318 x10E3/uL (ref 150–450)
RBC: 4.82 x10E6/uL (ref 4.14–5.80)
RDW: 12 % (ref 11.6–15.4)
WBC: 7.4 x10E3/uL (ref 3.4–10.8)

## 2023-11-04 NOTE — Telephone Encounter (Signed)
Pt returning call regarding test results. Please advise ?

## 2023-11-23 ENCOUNTER — Ambulatory Visit (INDEPENDENT_AMBULATORY_CARE_PROVIDER_SITE_OTHER): Admitting: Primary Care

## 2023-12-14 ENCOUNTER — Ambulatory Visit (INDEPENDENT_AMBULATORY_CARE_PROVIDER_SITE_OTHER): Admitting: Primary Care

## 2023-12-14 VITALS — BP 126/85 | HR 69 | Resp 16 | Ht 71.0 in | Wt 282.4 lb

## 2023-12-14 DIAGNOSIS — Z7689 Persons encountering health services in other specified circumstances: Secondary | ICD-10-CM

## 2023-12-14 DIAGNOSIS — I10 Essential (primary) hypertension: Secondary | ICD-10-CM | POA: Diagnosis not present

## 2023-12-14 DIAGNOSIS — Z1159 Encounter for screening for other viral diseases: Secondary | ICD-10-CM | POA: Diagnosis not present

## 2023-12-14 DIAGNOSIS — Z125 Encounter for screening for malignant neoplasm of prostate: Secondary | ICD-10-CM

## 2023-12-14 DIAGNOSIS — Z114 Encounter for screening for human immunodeficiency virus [HIV]: Secondary | ICD-10-CM

## 2023-12-14 DIAGNOSIS — E782 Mixed hyperlipidemia: Secondary | ICD-10-CM | POA: Diagnosis not present

## 2023-12-14 DIAGNOSIS — Z8673 Personal history of transient ischemic attack (TIA), and cerebral infarction without residual deficits: Secondary | ICD-10-CM

## 2023-12-14 NOTE — Progress Notes (Signed)
 New Patient Office Visit  Subjective    Patient ID: Aaron Crane male  DOB: Feb 24, 1971  Age: 53 y.o. MRN: [id number]   CC:  Establishment of care and intermittent numbness and tingling right side hand. HPI     New Patient (Initial Visit)    Additional comments: right wrist pain-carpel tunnel started hurting 2 weeks ago   Pt sees cardiology  Pt uses a cpap       Last edited by Aaron Crane, RMA on 12/14/2023  8:38 AM.      HPI  Aaron Crane is a 53 year old obese male who is in today to establish care.  He has given his wife permission to be at his appointment Aaron Crane and also provide information that he may overlook.  Blood pressure is unremarkable followed by cardiology.  He does voice concerns about intermittent pain on right side from shoulder that radiates down to his fingers with some numbness and tingling.  Negative for Phalen's and Tinel.  History of 2 strokes.  He also works in Holiday representative. Current Outpatient Medications on File Prior to Visit  Medication Sig Dispense Refill   amLODipine  (NORVASC ) 5 MG tablet Take 1 tablet (5 mg total) by mouth daily. 90 tablet 3   aspirin  EC 81 MG tablet Take 81 mg by mouth daily. Swallow whole.     atorvastatin  (LIPITOR) 80 MG tablet Take 1 tablet (80 mg total) by mouth daily. 90 tablet 3   lisinopril-hydrochlorothiazide (PRINZIDE,ZESTORETIC) 20-25 MG tablet lisinopril 20 mg-hydrochlorothiazide 25 mg tablet  TAKE 1 TABLET BY MOUTH ONCE DAILY IN THE MORNING     metoprolol  succinate (TOPROL  XL) 25 MG 24 hr tablet Take 1 tablet (25 mg total) by mouth daily. 90 tablet 3   nitroGLYCERIN  (NITROSTAT ) 0.4 MG SL tablet Place 1 tablet (0.4 mg total) under the tongue every 5 (five) minutes as needed for chest pain. 25 tablet PRN   No current facility-administered medications on file prior to visit.     No Known Allergies  Past Medical History:  Diagnosis Date   Headache    Hypercholesteremia    on meds   Hypertension    on  meds   Sleep apnea    uses CPAP nightly   Stroke [facility]) 2014     Past Surgical History:  Procedure Laterality Date   LEFT HEART CATH AND CORONARY ANGIOGRAPHY N/A 11/29/2022   Procedure: LEFT HEART CATH AND CORONARY ANGIOGRAPHY;  Surgeon: Aaron Bruckner, MD;  Location: [facility] INVASIVE CV LAB;  Service: Cardiovascular;  Laterality: N/A;   NO PAST SURGERIES       Family History  Problem Relation Age of Onset   Hypertension Sister    Sleep apnea Maternal Grandfather    Colon polyps Neg Hx    Colon cancer Neg Hx    Esophageal cancer Neg Hx    Rectal cancer Neg Hx    Stomach cancer Neg Hx     Social History   Socioeconomic History   Marital status: Married    Spouse name: Not on file   Number of children: 2   Years of education: Not on file   Highest education level: Some college, no degree  Occupational History   Not on file  Tobacco Use   Smoking status: Former    Current packs/day: 0.00    Types: Cigarettes    Start date: 05/31/2022    Quit date: 05/31/2022    Years since quitting: 1.5   Smokeless tobacco: Never  Tobacco comments:    Vapes daily   Vaping Use   Vaping status: Every Day  Substance and Sexual Activity   Alcohol use: Yes    Alcohol/week: 0.0 - 1.0 standard drinks of alcohol    Comment: occasional   Drug use: No   Sexual activity: Yes  Other Topics Concern   Not on file  Social History Narrative   Caffiene:  iced coffee (every 2wks)   Work Holiday representative.   Married, 2 kids- grown   Social Drivers of Corporate investment banker Strain: Low Risk  (12/14/2023)   Overall Financial Resource Strain (CARDIA)    Difficulty of Paying Living Expenses: Not very hard  Food Insecurity: No Food Insecurity (12/14/2023)   Hunger Vital Sign    Worried About Running Out of Food in the Last Year: Never true    Ran Out of Food in the Last Year: Never true  Transportation Needs: No Transportation Needs (12/14/2023)   PRAPARE - Administrator, Civil Service  (Medical): No    Lack of Transportation (Non-Medical): No  Physical Activity: Insufficiently Active (12/14/2023)   Exercise Vital Sign    Days of Exercise per Week: 2 days    Minutes of Exercise per Session: 30 min  Stress: Stress Concern Present (12/14/2023)   [employer] of Occupational Health - Occupational Stress Questionnaire    Feeling of Stress: To some extent  Social Connections: Moderately Integrated (12/14/2023)   Social Connection and Isolation Panel    Frequency of Communication with Friends and Family: More than three times a week    Frequency of Social Gatherings with Friends and Family: Once a week    Attends Religious Services: Never    Database administrator or Organizations: Yes    Attends Engineer, structural: More than 4 times per year    Marital Status: Married  Catering manager Violence: Not on file       Health Maintenance  Topic Date Due   COVID-19 Vaccine (1) Never done   HIV Screening  Never done   Hepatitis C Screening  Never done   Hepatitis B Vaccine (1 of 3 - 19+ 3-dose series) Never done   Zoster (Shingles) Vaccine (1 of 2) 03/15/2024*   Flu Shot  05/29/2024*   Pneumococcal Vaccine for age over 29 (1 of 2 - PCV) 12/13/2024*   Colon Cancer Screening  04/26/2025   DTaP/Tdap/Td vaccine (2 - Td or Tdap) 07/16/2031   HPV Vaccine  Aged Out   Meningitis B Vaccine  Aged Out  *Topic was postponed. The date shown is not the original due date.    Objective    BP 126/85   Pulse 69   Resp 16   Ht 5' 11 (1.803 m)   Wt 282 lb 6.4 oz (128.1 kg)   SpO2 99%   BMI 39.39 kg/m  BP Readings from Last 3 Encounters:  12/14/23 126/85  11/03/23 130/80  01/17/23 110/66       Physical Exam Vitals reviewed.  Constitutional:      Appearance: He is obese.  HENT:     Head: Normocephalic.     Right Ear: Tympanic membrane, ear canal and external ear normal.     Left Ear: Tympanic membrane, ear canal and external ear normal.     Nose: Nose  normal.     Mouth/Throat:     Comments: Lips chapped - maybe 2 -8 oz daily  Eyes:     Extraocular  Movements: Extraocular movements intact.     Pupils: Pupils are equal, round, and reactive to light.  Cardiovascular:     Rate and Rhythm: Normal rate and regular rhythm.  Pulmonary:     Effort: Pulmonary effort is normal.     Breath sounds: Normal breath sounds.  Abdominal:     General: Bowel sounds are normal. There is distension.     Palpations: Abdomen is soft.  Musculoskeletal:        General: Normal range of motion.     Cervical back: Normal range of motion and neck supple.  Skin:    General: Skin is warm and dry.  Neurological:     Mental Status: He is oriented to person, place, and time.  Psychiatric:        Mood and Affect: Mood normal.        Behavior: Behavior normal.        Thought Content: Thought content normal.        Judgment: Judgment normal.      Assessment & Plan:  Neyland was seen today for new patient (initial visit).  Diagnoses and all orders for this visit:  Encounter to establish care  Essential hypertension Well controlled managed by cardiology   Mixed hyperlipidemia  Healthy lifestyle diet of fruits vegetables fish nuts whole grains and low saturated fat . Foods high in cholesterol or liver, fatty meats,cheese, butter avocados, nuts and seeds, chocolate and fried foods. Statin    History of stroke No deficient   Prostate cancer screening -     PSA  Encounter for HCV screening test for low risk patient -     HCV Ab w Reflex to Quant PCR  Need for hepatitis B screening test -     Hepatitis B surface antibody,qualitative  Encounter for screening for HIV -     HIV Antibody (routine testing w rflx)      Follow-up:  Return in about 6 months (around 06/13/2024) for fasting labs, medical conditions.  The above assessment and management plan was discussed with the patient. The patient verbalized understanding of and has agreed to the  management plan. Patient is aware to call the clinic if symptoms fail to improve or worsen. Patient is aware when to return to the clinic for a follow-up visit. Patient educated on when it is appropriate to go to the emergency department.   Rosaline Bohr, NP-C

## 2023-12-15 LAB — HCV AB W REFLEX TO QUANT PCR: HCV Ab: NONREACTIVE

## 2023-12-15 LAB — HCV INTERPRETATION

## 2023-12-15 LAB — HIV ANTIBODY (ROUTINE TESTING W REFLEX): HIV Screen 4th Generation wRfx: NONREACTIVE

## 2023-12-15 LAB — HEPATITIS B SURFACE ANTIBODY,QUALITATIVE: Hep B Surface Ab, Qual: NONREACTIVE

## 2023-12-15 LAB — PSA: Prostate Specific Ag, Serum: 0.6 ng/mL (ref 0.0–4.0)

## 2023-12-26 ENCOUNTER — Ambulatory Visit (INDEPENDENT_AMBULATORY_CARE_PROVIDER_SITE_OTHER): Payer: Self-pay | Admitting: Primary Care

## 2023-12-26 ENCOUNTER — Ambulatory Visit: Admitting: Pharmacist Clinician (PhC)/ Clinical Pharmacy Specialist

## 2024-02-14 ENCOUNTER — Encounter: Payer: Self-pay | Admitting: Emergency Medicine

## 2024-02-14 ENCOUNTER — Ambulatory Visit: Attending: Emergency Medicine | Admitting: Emergency Medicine

## 2024-02-14 VITALS — BP 116/78 | HR 87 | Ht 71.0 in | Wt 291.0 lb

## 2024-02-14 DIAGNOSIS — G4733 Obstructive sleep apnea (adult) (pediatric): Secondary | ICD-10-CM | POA: Insufficient documentation

## 2024-02-14 DIAGNOSIS — E785 Hyperlipidemia, unspecified: Secondary | ICD-10-CM

## 2024-02-14 DIAGNOSIS — E782 Mixed hyperlipidemia: Secondary | ICD-10-CM | POA: Diagnosis present

## 2024-02-14 DIAGNOSIS — Z8673 Personal history of transient ischemic attack (TIA), and cerebral infarction without residual deficits: Secondary | ICD-10-CM | POA: Diagnosis not present

## 2024-02-14 DIAGNOSIS — I10 Essential (primary) hypertension: Secondary | ICD-10-CM | POA: Diagnosis not present

## 2024-02-14 DIAGNOSIS — I2510 Atherosclerotic heart disease of native coronary artery without angina pectoris: Secondary | ICD-10-CM

## 2024-02-14 DIAGNOSIS — Z79899 Other long term (current) drug therapy: Secondary | ICD-10-CM | POA: Insufficient documentation

## 2024-02-14 LAB — COMPREHENSIVE METABOLIC PANEL WITH GFR
ALT: 25 IU/L (ref 0–44)
AST: 30 IU/L (ref 0–40)
Albumin: 4.5 g/dL (ref 3.8–4.9)
Alkaline Phosphatase: 94 IU/L (ref 47–123)
BUN/Creatinine Ratio: 14 (ref 9–20)
BUN: 13 mg/dL (ref 6–24)
Bilirubin Total: 0.6 mg/dL (ref 0.0–1.2)
CO2: 21 mmol/L (ref 20–29)
Calcium: 9.8 mg/dL (ref 8.7–10.2)
Chloride: 99 mmol/L (ref 96–106)
Creatinine, Ser: 0.91 mg/dL (ref 0.76–1.27)
Globulin, Total: 2.8 g/dL (ref 1.5–4.5)
Glucose: 103 mg/dL — ABNORMAL HIGH (ref 70–99)
Potassium: 4.1 mmol/L (ref 3.5–5.2)
Sodium: 139 mmol/L (ref 134–144)
Total Protein: 7.3 g/dL (ref 6.0–8.5)
eGFR: 102 mL/min/1.73 (ref >59)

## 2024-02-14 LAB — LIPID PANEL
Chol/HDL Ratio: 3.9 ratio (ref 0.0–5.0)
Cholesterol, Total: 194 mg/dL (ref 100–199)
HDL: 50 mg/dL (ref >39)
LDL Chol Calc (NIH): 120 mg/dL — ABNORMAL HIGH (ref 0–99)
Triglycerides: 134 mg/dL (ref 0–149)
VLDL Cholesterol Cal: 24 mg/dL (ref 5–40)

## 2024-02-14 NOTE — Patient Instructions (Addendum)
 Medication Instructions:  NO CHANGES  Lab Work: FASTING LIPID PANEL AND CMET TO BE DONE TODAY.  Testing/Procedures: NONE  Follow-Up: At [facility], you and your health needs are our priority.  As part of our continuing mission to provide you with exceptional heart care, our providers are all part of one team.  This team includes your primary Cardiologist (physician) and Advanced Practice Providers or APPs (Physician Assistants and Nurse Practitioners) who all work together to provide you with the care you need, when you need it.  Your next appointment:   6 MONTHS (DR. FRANCK AZOBOU TO ESTABLISH CARE)  Provider:   Joelle VEAR Ren Donley, MD

## 2024-02-14 NOTE — Progress Notes (Addendum)
 Cardiology Office Note:    Date:  02/14/2024  ID:  Aaron Crane, DOB 1970-04-20, MRN [id number] PCP: Aaron Rosaline SQUIBB, NP  [facility] Providers Cardiologist:  Aaron VEAR Ren Donley, MD Cardiology APP:  Aaron Lum CROME, NP       Patient Profile:       Chief Complaint: 72-month follow-up History of Present Illness:  Aaron Crane is a 53 y.o. male with visit-pertinent history of CAD, hypertension, hyperlipidemia, OSA on CPAP, history of reported CVA in 51 in [place]   Exercise tolerance test 04/2019 was abnormal due to development of chest discomfort and ST segment changes.  Did not have insurance that time I was working to get assistant for CT coverage for further testing.  He was lost to follow-up.   He presented to our office on 11/11/2022 post ED visit for left-sided chest pain and neck pain.  In clinic he noted intermittent chest pain and tingling sensation in his arm.  Pain occurred at rest and during exertion.  EKG showed minor T wave changes inferiorly. Coronary CTA ordered showing calcium  score of 39.5 which is 89 percentile.  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    Left heart cath ordered and completed on 11/29/2022.  LHC showed moderate to severe multivessel CAD predominantly affected small/distal branches, including 70-80% D2 and distal LAD lesions, and sequential 50-70% proximal through distal LCx stenoses.  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  Normal LVEF 55-65% and normal LVEDP at 15 mmHg.  He was recommended to escalate antianginal therapy with initiation of metoprolol  succinate 25mg .  It was noted if the patient has refractory angina despite being on maximally tolerated doses of 2 antianginal agents, PCI of the mid/distal Lcx +/- distal LAD could be considered   Last seen in clinic on 11/03/2023.  Patient doing well and reported mild stable  and predictable chest pains. Amlodipine  5 mg daily was started to further escalate antianginal therapy.   Discussed the use of AI scribe software for clinical note transcription with the patient, who gave verbal consent to proceed.  History of Present Illness Aaron Crane is a 53 year old male with coronary artery disease and hyperlipidemia who presents for follow-up of his cardiovascular health.  Today he is doing well without acute cardiovascular concerns or complaints.  He has had no recent chest pain or dyspnea.  He had one anxiety episode during a stressful phone call with transient blood pressure elevation, for which he took nitroglycerin  but never experienced any chest pains at that time.  He takes atorvastatin , amlodipine , lisinopril, hydrochlorothiazide, metoprolol , and daily baby aspirin  without missed doses. His LDL has risen from 81 to 145 over the past year despite maximal atorvastatin  and with no reported missed doses.  He had a prior stroke in [place]  and reports no new focal neurologic symptoms. He uses CPAP for sleep apnea without problems.  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 He denies chest pain, palpitations, syncope, or lightheadedness with exertion.  No orthopnea, PND, LEE.   Review of systems:  Please see the history of present illness. All other systems are reviewed and otherwise negative.      Studies Reviewed:        Cardiac catheterization 11/29/2022 Conclusions: Moderate to severe multivessel coronary artery disease predominantly affected small/distal branchs, including 70-80% D2 and distal  LAD lesions, and sequential 50-70% proximal through distal LCx stenoses. 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 Normal left ventricular systolic function (LVEF 55-65%) and filling pressure (LVEDP 15 mmHg). Recommendations: Escalate  antianginal therapy with increase in metoprolol  succinate to 25 mg daily. Aggressive secondary prevention of coronary artery disease. If the patient has refractor angina despite being on maximally tolerated doses of two antianginal agents, PCI to mid/distal LCx +/- distal LAD could be considered. Diagnostic Dominance: Right  Coronary CTA 11/18/2022 IMPRESSION: 1.  Calcium  score 39.5 which is 89 th percentile for age/sex   2.  Normal ascending thoracic aorta 3.0 cm   3. 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 Moderate calcified stenosis in mid vessel as it courses behind aorta   And moderate calcified stenosis to OM1 after it emerges on the lateral wall Study will be sent for FFR CT  FFR FFR CT positive in anomalous LCX particulary the OM after vessel courses posterior to the aorta Suggest further evaluation Risk Assessment/Calculations:              Physical Exam:   VS:  BP 116/78 (BP Location: Right Arm, Patient Position: Sitting, Cuff Size: Large)   Pulse 87   Ht 5' 11 (1.803 m)   Wt 291 lb (132 kg)   BMI 40.59 kg/m    Wt Readings from Last 3 Encounters:  02/14/24 291 lb (132 kg)  12/14/23 282 lb 6.4 oz (128.1 kg)  11/03/23 276 lb (125.2 kg)    GEN: Well nourished, well developed in no acute distress NECK: No JVD; No carotid bruits CARDIAC: RRR, no murmurs, rubs, gallops RESPIRATORY:  Clear to auscultation without rales, wheezing or rhonchi  ABDOMEN: Soft, non-tender, non-distended EXTREMITIES:  No edema; No acute deformity      Assessment and Plan:  Coronary artery disease LHC 10/2022 with severe multivessel disease predominantly affected small/distal branches, including 70-80% D2 and distal LAD lesions, and sequential 50-70% proximal through distal LCx stenoses - Per cath report, if patient has refractory angina despite being on maximally tolerated doses of 2 antianginal agents, PCI to mid/distal LCx +/- distal LAD could be considered - Today he he is  stable without chest pains.  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  No symptoms to suggest active angina.  No indication for further intervention at this time - Chest pains did resolve since starting amlodipine  at last office visit - Continue amlodipine  5 mg daily, metoprolol  XL 25 mg daily, aspirin  81 mg daily, atorvastatin  80 mg daily, and as needed nitroglycerin    Hypertension Blood pressure today is 116/78 and well-controlled - Continue lisinopril-hydrochlorothiazide 20-25 mg daily and amlodipine  5 mg daily   Hyperlipidemia, LDL goal < 70 LDL 81 on 12/2022 LDL 145 on 10/2023 (most recent) - Denies any lapse in medication adherence has been on max dose of atorvastatin  since 12/2022 - Plan for fasting lipid panel today  - Continue atorvastatin  80 mg daily - Follow-up with Pharm.D. lipid clinic for consideration of injectable cholesterol medication   OSA - Remains adherent to CPAP   History of stroke Occurred in 2015 while living in [place] - Denies new focal neurological symptoms - On secondary prevention with aspirin  81 mg daily and atorvastatin  80 mg daily      Dispo:  Return in about 6 months (around 08/14/2024).  Former Dr. Alvan and Dr. Lonni patient.  I will  have patient establish care with Dr. Ren  Signed, Lum LITTIE Louis, NP

## 2024-02-15 ENCOUNTER — Encounter: Payer: Self-pay | Admitting: Pharmacist Clinician (PhC)/ Clinical Pharmacy Specialist

## 2024-02-15 ENCOUNTER — Ambulatory Visit: Admitting: Pharmacist Clinician (PhC)/ Clinical Pharmacy Specialist

## 2024-02-15 ENCOUNTER — Telehealth: Payer: Self-pay | Admitting: Pharmacist Clinician (PhC)/ Clinical Pharmacy Specialist

## 2024-02-15 ENCOUNTER — Ambulatory Visit

## 2024-02-15 ENCOUNTER — Ambulatory Visit: Payer: Self-pay | Admitting: Emergency Medicine

## 2024-02-15 DIAGNOSIS — I2510 Atherosclerotic heart disease of native coronary artery without angina pectoris: Secondary | ICD-10-CM | POA: Diagnosis not present

## 2024-02-15 DIAGNOSIS — E782 Mixed hyperlipidemia: Secondary | ICD-10-CM

## 2024-02-15 NOTE — Progress Notes (Signed)
 Office Visit    Patient Name: Aaron Crane Date of Encounter: 02/15/2024  Primary Care Provider:  Celestia Rosaline SQUIBB, NP Primary Cardiologist:  Joelle VEAR Ren Donley, MD  Chief Complaint    Hyperlipidemia   Significant Past Medical History   CAD Cath 9/24 - 75% dLAD, 2nd DX 75%, Cx with 50% prox and mid stenosis, 70% distal  HTN Controlled on lisinopril hctz, amlodipine , metoprolol   OSA On CPAP nightly  CVA Hx of stroke in 2014, no residual damage     Allergies[1]  History of Present Illness    Aaron Crane is a 53 y.o. male patient of Dr Ren Donley, in the office today to discuss options for cholesterol management.  He was most recently seen yesterday by Lum Louis NP for follow up on his coronary disease.  It was noted that his LDL cholesterol had increased from 81 to 145 over a year, but patient denied issues with medication compliance.  A repeat lab was done yesterday, showing LDL at 120.  Patient currently taking atorvastatin  80 mg, Epic notes refills at appropriate intervals.    Insurance Carrier:  [employer] commercial (no change in 2026)  LDL Cholesterol goal:  LDL < 70  Current Medications:   atorvastatin  80 mg daily  Social Hx: Tobacco: former smoker Alcohol: occasional    Diet:  doing better now, admits that much of the past year was eating out, as he travelled between here and fixing up a family property in [place] .  Gained about 20 pounds, but is now back to eating more home cooked foods.      Accessory Clinical Findings   Lab Results  Component Value Date   CHOL 194 02/14/2024   HDL 50 02/14/2024   LDLCALC 120 (H) 02/14/2024   TRIG 134 02/14/2024   CHOLHDL 3.9 02/14/2024    No results found for: LIPOA  Lab Results  Component Value Date   ALT 25 02/14/2024   AST 30 02/14/2024   ALKPHOS 94 02/14/2024   BILITOT 0.6 02/14/2024   Lab Results  Component Value Date   CREATININE 0.91 02/14/2024   BUN 13 02/14/2024   NA 139 02/14/2024   K  4.1 02/14/2024   CL 99 02/14/2024   CO2 21 02/14/2024   No results found for: HGBA1C  Home Medications    Current Outpatient Medications  Medication Sig Dispense Refill   amLODipine  (NORVASC ) 5 MG tablet Take 1 tablet (5 mg total) by mouth daily. 90 tablet 3   aspirin  EC 81 MG tablet Take 81 mg by mouth daily. Swallow whole.     atorvastatin  (LIPITOR) 80 MG tablet Take 1 tablet (80 mg total) by mouth daily. 90 tablet 3   lisinopril-hydrochlorothiazide (PRINZIDE,ZESTORETIC) 20-25 MG tablet lisinopril 20 mg-hydrochlorothiazide 25 mg tablet  TAKE 1 TABLET BY MOUTH ONCE DAILY IN THE MORNING     metoprolol  succinate (TOPROL  XL) 25 MG 24 hr tablet Take 1 tablet (25 mg total) by mouth daily. 90 tablet 3   nitroGLYCERIN  (NITROSTAT ) 0.4 MG SL tablet Place 1 tablet (0.4 mg total) under the tongue every 5 (five) minutes as needed for chest pain. 25 tablet PRN   No current facility-administered medications for this visit.     Assessment & Plan    Mixed hyperlipidemia Assessment: Patient with ASCVD not at LDL goal of < 70 Most recent LDL 120 on 02/14/24 Has been compliant with high intensity statin : atorvastatin  80 mg daily  Reviewed options for lowering LDL cholesterol, specifically PCSK-9  inhibitors.  Discussed mechanisms of action, dosing, side effects, potential decreases in LDL cholesterol and costs.  Also reviewed potential options for patient assistance.  Plan: Patient agreeable to starting Repatha 140 mg q14d Repeat labs after:  3 months Lipid Liver function Patient was given information on how to apply online for copay card   Allean Mink, PharmD CPP [facility] [address]   [place], [place] [address] [phone]  02/15/2024, 3:43 PM       [1] No Known Allergies

## 2024-02-15 NOTE — Telephone Encounter (Signed)
 Please do PA for Repatha

## 2024-02-15 NOTE — Assessment & Plan Note (Signed)
 Assessment: Patient with ASCVD not at LDL goal of < 70 Most recent LDL 120 on 02/14/24 Has been compliant with high intensity statin : atorvastatin  80 mg daily  Reviewed options for lowering LDL cholesterol, specifically PCSK-9 inhibitors.  Discussed mechanisms of action, dosing, side effects, potential decreases in LDL cholesterol and costs.  Also reviewed potential options for patient assistance.  Plan: Patient agreeable to starting Repatha 140 mg q14d Repeat labs after:  3 months Lipid Liver function Patient was given information on how to apply online for copay card

## 2024-02-15 NOTE — Patient Instructions (Signed)
 Your Results:             Your most recent labs Goal  Total Cholesterol 194 < 200  Triglycerides 134 < 150  HDL (happy/good cholesterol) 50 > 40  LDL (lousy/bad cholesterol 120 < 70   Medication changes:  We will start the process to get Repatha covered by your insurance.  Once the prior authorization is complete, I will call/send a MyChart message to let you know and confirm pharmacy information.   You will take one injection every 14 days  Lab orders:  We want to repeat labs after 2-3 months.  We will send you a lab order to remind you once we get closer to that time.    Repatha Co-pay Card Instructions 1.      Go to [email or web address]  2.      Click the red Repahta Co-Pay Card button near the top right 3.      Scroll down and click Yes, I have a Repatha prescription 4.      Continue to scroll down and selected [employer]  5.      Continue to scroll down and for the question Are you eligible for Medicare but receiving prescription drug coverage from a former employer, union, or welfare plan? select No 6.      Continue to scroll down and click the first box which is next to Repatha Co-Pay Card, and beneath that, select I want to enroll in the Repatha Co-Pay Card Program 7.      Continue to scroll down until you see the Next button, then click it 8.      Fill out your personal information then click the Next button    Thank you for choosing [facility]

## 2024-02-16 ENCOUNTER — Other Ambulatory Visit (HOSPITAL_COMMUNITY): Payer: Self-pay

## 2024-02-16 ENCOUNTER — Telehealth: Payer: Self-pay | Admitting: Pharmacy Technician

## 2024-02-16 ENCOUNTER — Encounter: Payer: Self-pay | Admitting: Pharmacist Clinician (PhC)/ Clinical Pharmacy Specialist

## 2024-02-16 DIAGNOSIS — E782 Mixed hyperlipidemia: Secondary | ICD-10-CM

## 2024-02-16 MED ORDER — EZETIMIBE 10 MG PO TABS: 10.0000 mg | ORAL_TABLET | Freq: Every day | ORAL | 3 refills | Status: AC

## 2024-02-16 NOTE — Telephone Encounter (Signed)
° °  Pharmacy Patient Advocate Encounter   Received notification from Pt Calls Messages that prior authorization for repatha is required/requested.   Insurance verification completed.   The patient is insured through [facility] MEDICAID.   Per test claim: PA required; PA submitted to above mentioned insurance via Latent Key/confirmation #/EOC [id number] Status is pending

## 2024-02-16 NOTE — Addendum Note (Signed)
 Addended by: Shelita Steptoe L on: 02/16/2024 02:49 PM   Modules accepted: Orders

## 2024-02-16 NOTE — Telephone Encounter (Signed)
 Pharmacy Patient Advocate Encounter  Received notification from [facility] MEDICAID that Prior Authorization for repatha has been DENIED.  See denial reason below. No denial letter attached in CMM. Will attach denial letter to Media tab once received.   PA #/Case ID/Reference #: [id number]      Couldn't find where he has ever been on zetia 

## 2024-03-12 ENCOUNTER — Other Ambulatory Visit: Payer: Self-pay

## 2024-03-12 ENCOUNTER — Emergency Department (HOSPITAL_COMMUNITY)
Admission: EM | Admit: 2024-03-12 | Discharge: 2024-03-12 | Disposition: A | Discharge status: Home / Self Care | Attending: Emergency Medicine | Admitting: Emergency Medicine

## 2024-03-12 ENCOUNTER — Encounter (HOSPITAL_COMMUNITY): Payer: Self-pay

## 2024-03-12 ENCOUNTER — Emergency Department (HOSPITAL_COMMUNITY)

## 2024-03-12 DIAGNOSIS — Z7982 Long term (current) use of aspirin: Secondary | ICD-10-CM | POA: Insufficient documentation

## 2024-03-12 DIAGNOSIS — Z79899 Other long term (current) drug therapy: Secondary | ICD-10-CM | POA: Diagnosis not present

## 2024-03-12 DIAGNOSIS — R55 Syncope and collapse: Secondary | ICD-10-CM | POA: Diagnosis not present

## 2024-03-12 DIAGNOSIS — I2089 Other forms of angina pectoris: Secondary | ICD-10-CM | POA: Diagnosis not present

## 2024-03-12 DIAGNOSIS — I10 Essential (primary) hypertension: Secondary | ICD-10-CM | POA: Insufficient documentation

## 2024-03-12 DIAGNOSIS — D72829 Elevated white blood cell count, unspecified: Secondary | ICD-10-CM | POA: Diagnosis not present

## 2024-03-12 DIAGNOSIS — R079 Chest pain, unspecified: Secondary | ICD-10-CM | POA: Diagnosis present

## 2024-03-12 LAB — CBC WITH DIFFERENTIAL/PLATELET
Abs Immature Granulocytes: 0.08 K/uL — ABNORMAL HIGH (ref 0.00–0.07)
Basophils Absolute: 0.1 K/uL (ref 0.0–0.1)
Basophils Relative: 0 %
Eosinophils Absolute: 0.1 K/uL (ref 0.0–0.5)
Eosinophils Relative: 1 %
HCT: 46.7 % (ref 39.0–52.0)
Hemoglobin: 16.1 g/dL (ref 13.0–17.0)
Immature Granulocytes: 1 %
Lymphocytes Relative: 13 %
Lymphs Abs: 2.2 K/uL (ref 0.7–4.0)
MCH: 32 pg (ref 26.0–34.0)
MCHC: 34.5 g/dL (ref 30.0–36.0)
MCV: 92.8 fL (ref 80.0–100.0)
Monocytes Absolute: 1.1 K/uL — ABNORMAL HIGH (ref 0.1–1.0)
Monocytes Relative: 6 %
Neutro Abs: 13.1 K/uL — ABNORMAL HIGH (ref 1.7–7.7)
Neutrophils Relative %: 79 %
Platelets: 334 K/uL (ref 150–400)
RBC: 5.03 MIL/uL (ref 4.22–5.81)
RDW: 11.6 % (ref 11.5–15.5)
WBC: 16.6 K/uL — ABNORMAL HIGH (ref 4.0–10.5)
nRBC: 0 % (ref 0.0–0.2)

## 2024-03-12 LAB — BASIC METABOLIC PANEL WITH GFR
Anion gap: 10 (ref 5–15)
BUN: 13 mg/dL (ref 6–20)
CO2: 30 mmol/L (ref 22–32)
Calcium: 9.8 mg/dL (ref 8.9–10.3)
Chloride: 96 mmol/L — ABNORMAL LOW (ref 98–111)
Creatinine, Ser: 1.09 mg/dL (ref 0.61–1.24)
GFR, Estimated: >60 mL/min
Glucose, Bld: 108 mg/dL — ABNORMAL HIGH (ref 70–99)
Potassium: 3.6 mmol/L (ref 3.5–5.1)
Sodium: 136 mmol/L (ref 135–145)

## 2024-03-12 LAB — HEPATIC FUNCTION PANEL
ALT: 32 U/L (ref 0–44)
AST: 35 U/L (ref 15–41)
Albumin: 4.6 g/dL (ref 3.5–5.0)
Alkaline Phosphatase: 99 U/L (ref 38–126)
Bilirubin, Direct: 0.1 mg/dL (ref 0.0–0.2)
Indirect Bilirubin: 0.3 mg/dL (ref 0.3–0.9)
Total Bilirubin: 0.5 mg/dL (ref 0.0–1.2)
Total Protein: 8.1 g/dL (ref 6.5–8.1)

## 2024-03-12 LAB — RESP PANEL BY RT-PCR (RSV, FLU A&B, COVID)  RVPGX2
Influenza A by PCR: NEGATIVE
Influenza B by PCR: NEGATIVE
Resp Syncytial Virus by PCR: NEGATIVE
SARS Coronavirus 2 by RT PCR: NEGATIVE

## 2024-03-12 LAB — MAGNESIUM: Magnesium: 2.2 mg/dL (ref 1.7–2.4)

## 2024-03-12 LAB — LIPASE, BLOOD: Lipase: 40 U/L (ref 11–51)

## 2024-03-12 LAB — TROPONIN T, HIGH SENSITIVITY
Troponin T High Sensitivity: <15 ng/L (ref 0–19)
Troponin T High Sensitivity: <15 ng/L (ref 0–19)

## 2024-03-12 LAB — CBG MONITORING, ED: Glucose-Capillary: 109 mg/dL — ABNORMAL HIGH (ref 70–99)

## 2024-03-12 MED ORDER — OXYCODONE-ACETAMINOPHEN 5-325 MG PO TABS
1.0000 | ORAL_TABLET | Freq: Once | ORAL | Status: AC
  Administered 2024-03-12: 1 via ORAL
  Filled 2024-03-12: qty 1

## 2024-03-12 MED ORDER — SODIUM CHLORIDE 0.9 % IV BOLUS
1000.0000 mL | Freq: Once | INTRAVENOUS | Status: AC
  Administered 2024-03-12: 1000 mL via INTRAVENOUS

## 2024-03-12 NOTE — ED Notes (Signed)
Lab called for hepatic function add on

## 2024-03-12 NOTE — ED Triage Notes (Signed)
 Pt is here with chest pain and has cardiac history. Pt took one ntg at home. BP 92/46 and now 128/72. HR 60s. IV 20g - 300NS. 6/10 chest pain that did have radiation to left jaw, but the pain is better now. Reported a bubbly feeling in chest. Feels better sitting forward. Pt had 4 basa from fire dept.

## 2024-03-12 NOTE — ED Provider Triage Note (Signed)
 Emergency Medicine Provider Triage Evaluation Note  Amrit Cress , a 54 y.o. male  was evaluated in triage.  Pt complains of chest pain.  Review of Systems  Positive: Chest pain, lightheaded Negative: Fevers, chills  Physical Exam  There were no vitals taken for this visit. Gen:   Awake, no distress   Resp:  Normal effort  MSK:   Moves extremities without difficulty   Medical Decision Making  Medically screening exam initiated at 11:13 AM.  Appropriate orders placed.  Kellon Chalk was informed that the remainder of the evaluation will be completed by another provider, this initial triage assessment does not replace that evaluation, and the importance of remaining in the ED until their evaluation is complete.  Patient presents for chest pain.  Onset was this morning while at work.  It was in the setting of exertion.  He also experienced diaphoresis and lightheadedness.  Initial chest pain did have radiation to jaw.  He took 2 aspirin  and rested.  He had ongoing chest pain despite this.  He went home where he took a nitroglycerin  which did seem to help.  EMS was called.  Fire department arrived on scene and gave him 4 more aspirin .  He has had some improvement in his chest discomfort.  It is now described as a slight discomfort.   Melvenia Motto, MD 03/12/24 1115

## 2024-03-12 NOTE — Discharge Instructions (Addendum)
 It was a pleasure caring for you today in the emergency department.  Your workup today was reassuring.  Please call cardiology to arrange follow-up in the office.  Be sure to get plenty of rest over the next few days, drink plenty of liquids and eat a heart healthy diet. Follow up with your heart doctor and primary care doctor in the office   Return to the Emergency Department if you have unusual chest pain, pressure, or discomfort, shortness of breath, nausea, vomiting, burping, heartburn, tingling upper body parts, sweating, cold, clammy skin, or racing heartbeat. Call 911 if you think you are having a heart attack. Take all cardiac medications as prescribed - notify your doctor if you have any side effects. Follow cardiac diet - avoid fatty & fried foods, don't eat too much red meat, eat lots of fruits & vegetables, and dairy products should be low fat. Please lose weight if you are overweight. Become more active with walking, gardening, or any other activity that gets you to moving.   Please return to the emergency department immediately for any new or concerning symptoms, or if you get worse.  Please return to the emergency department for any worsening or worrisome symptoms.

## 2024-03-12 NOTE — ED Notes (Signed)
 Patient ambulated with stand by assist around the unit. Denies pain or shob.

## 2024-03-12 NOTE — ED Provider Notes (Signed)
 " [facility] Provider Note  CSN: [id number] Arrival date & time: 03/12/24 1107  Chief Complaint(s) Chest Pain  HPI Aaron Crane is a 54 y.o. 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  Felt he was going to have LOC, he went back to his truck and sat down, the symptoms improved and he began having some chest pressure, gas pain.  He took aspirin  and nitroglycerin , symptoms have since subsided.  Denies any dyspnea.  No palpitations.  No syncope.  Denies similar symptoms in the past.  Reports did not eat breakfast this morning or drink much fluid.  He was working hard and was wearing multiple layers of clothing. Denies similar symptoms in the past.  Compliant with his typical medications  Follows with [facility]  Prior [facility] 9/24, moderate to severe multivessel CAD, antianginal therapy was increased, consideration of future PCI if symptoms persist  He is here with family  Past Medical History Past Medical History:  Diagnosis Date   Headache    Hypercholesteremia    on meds   Hypertension    on meds   Sleep apnea    uses CPAP nightly   Stroke [facility]) 2014   Patient Active Problem List   Diagnosis Date Noted   History of stroke 05/02/2019   Essential hypertension 05/02/2019   Mixed hyperlipidemia 05/02/2019   Pain in joint of right shoulder 06/28/2017   Home Medication(s) Prior to Admission medications  Medication Sig Start Date End Date Taking? Authorizing Provider  amLODipine  (NORVASC ) 5 MG tablet Take 1 tablet (5 mg total) by mouth daily. 11/03/23   Rana Lum CROME, NP  aspirin  EC 81 MG tablet Take 81 mg by mouth daily. Swallow whole.    [provider]  atorvastatin  (LIPITOR) 80  MG tablet Take 1 tablet (80 mg total) by mouth daily. 02/04/23 02/14/24  Rana Lum CROME, NP  ezetimibe  (ZETIA ) 10 MG tablet Take 1 tablet (10 mg total) by mouth daily. 02/16/24 05/16/24  Alvstad, Kristin L, RPH-CPP  lisinopril-hydrochlorothiazide (PRINZIDE,ZESTORETIC) 20-25 MG tablet lisinopril 20 mg-hydrochlorothiazide 25 mg tablet  TAKE 1 TABLET BY MOUTH ONCE DAILY IN THE MORNING    [provider]  metoprolol  succinate (TOPROL  XL) 25 MG 24 hr tablet Take 1 tablet (25 mg total) by mouth daily. 01/17/23   Rana Lum CROME, NP  nitroGLYCERIN  (NITROSTAT ) 0.4 MG SL tablet Place 1 tablet (0.4 mg total) under the tongue every 5 (five) minutes as needed for chest pain. 11/29/22 02/14/24  Mady Bruckner, MD  Past Surgical History Past Surgical History:  Procedure Laterality Date   LEFT HEART CATH AND CORONARY ANGIOGRAPHY N/A 11/29/2022   Procedure: LEFT HEART CATH AND CORONARY ANGIOGRAPHY;  Surgeon: Mady Bruckner, MD;  Location: [facility] INVASIVE CV LAB;  Service: Cardiovascular;  Laterality: N/A;   NO PAST SURGERIES     Family History Family History  Problem Relation Age of Onset   Hypertension Sister    Sleep apnea Maternal Grandfather    Colon polyps Neg Hx    Colon cancer Neg Hx    Esophageal cancer Neg Hx    Rectal cancer Neg Hx    Stomach cancer Neg Hx     Social History Social History[1] Allergies Patient has no known allergies.  Review of Systems A thorough review of systems was obtained and all systems are negative except as noted in the HPI and PMH.   Physical Exam Vital Signs  I have reviewed the triage vital signs BP (!) 121/59   Pulse 61   Temp 97.9 F (36.6 C) (Oral)   Resp 11   SpO2 100%  Physical Exam Vitals and nursing note reviewed.  Constitutional:      General: He is not in acute distress.    Appearance: He is  well-developed. He is obese. He is not ill-appearing.  HENT:     Head: Normocephalic and atraumatic.     Right Ear: External ear normal.     Left Ear: External ear normal.     Mouth/Throat:     Mouth: Mucous membranes are moist.  Eyes:     General: No scleral icterus. Cardiovascular:     Rate and Rhythm: Normal rate and regular rhythm.     Pulses: Normal pulses.     Heart sounds: Normal heart sounds. No murmur heard. Pulmonary:     Effort: Pulmonary effort is normal. No tachypnea, accessory muscle usage or respiratory distress.     Breath sounds: Normal breath sounds.  Abdominal:     General: Abdomen is flat.     Palpations: Abdomen is soft.     Tenderness: There is no abdominal tenderness.  Musculoskeletal:     Cervical back: No rigidity.     Right lower leg: No edema.     Left lower leg: No edema.  Skin:    General: Skin is warm and dry.     Capillary Refill: Capillary refill takes less than 2 seconds.  Neurological:     Mental Status: He is alert.  Psychiatric:        Mood and Affect: Mood normal.        Behavior: Behavior normal.     ED Results and Treatments Labs (all labs ordered are listed, but only abnormal results are displayed) Labs Reviewed  BASIC METABOLIC PANEL WITH GFR - Abnormal; Notable for the following components:      Result Value   Chloride 96 (*)    Glucose, Bld 108 (*)    All other components within normal limits  CBC WITH DIFFERENTIAL/PLATELET - Abnormal; Notable for the following components:   WBC 16.6 (*)    Neutro Abs 13.1 (*)    Monocytes Absolute 1.1 (*)    Abs Immature Granulocytes 0.08 (*)    All other components within normal limits  CBG MONITORING, ED - Abnormal; Notable for the following components:   Glucose-Capillary 109 (*)    All other components within normal limits  RESP PANEL BY RT-PCR (RSV, FLU A&B, COVID)  RVPGX2  MAGNESIUM  LIPASE, BLOOD  HEPATIC FUNCTION PANEL  TROPONIN T, HIGH SENSITIVITY  TROPONIN T, HIGH  SENSITIVITY                                                                                                                          Radiology DG Chest 2 View Result Date: 03/12/2024 EXAM: 2 VIEW(S) XRAY OF THE CHEST 03/12/2024 12:12:00 PM COMPARISON: 09/07/2022 CLINICAL HISTORY: Chest pain FINDINGS: LINES, TUBES AND DEVICES: Leads and wires over chest. LUNGS AND PLEURA: No focal pulmonary opacity. No pleural effusion. No pneumothorax. HEART AND MEDIASTINUM: No acute abnormality of the cardiac and mediastinal silhouettes. BONES AND SOFT TISSUES: No acute osseous abnormality. IMPRESSION: 1. No acute findings. Electronically signed by: Waddell Calk MD MD 03/12/2024 02:17 PM EST RP Workstation: [id number]    Pertinent labs & imaging results that were available during my care of the patient were reviewed by me and considered in my medical decision making (see MDM for details).  Medications Ordered in ED Medications  oxyCODONE -acetaminophen  (PERCOCET/ROXICET) 5-325 MG per tablet 1 tablet (1 tablet Oral Given 03/12/24 1221)  sodium chloride  0.9 % bolus 1,000 mL (0 mLs Intravenous Stopped 03/12/24 1426)                                                                                                                                     Procedures Procedures  (including critical care time)  Medical Decision Making / ED Course    Medical Decision Making:    Reco Shonk is a 54 y.o. male  with past medical history as below, significant for hyperlipidemia, hypertension, OSA on CPAP, CVA who presents to the ED with complaint of lightheaded, chest pain. The complaint involves an extensive differential diagnosis and also carries with it a high risk of complications and morbidity.  Serious etiology was considered. Ddx includes but is not limited to: Differential includes all life-threatening causes for chest pain. This includes but is not exclusive to acute coronary syndrome, aortic dissection, pulmonary  embolism, cardiac tamponade, community-acquired pneumonia, pericarditis, musculoskeletal chest wall pain, etc.   Complete initial physical exam performed, notably the patient was in no acute distress, currently pain-free.    Reviewed and confirmed nursing documentation for past medical history, family history, social history.  Vital signs reviewed.    Near syncope Chest pain Stable angina > - Near syncope with exertion, did not have LOC.  Been having chest pain  once his lightheadedness/near syncope symptoms improved.  Currently pain-free - Pain was a gas/pressure feeling, some radiation to his jaw.  Has since resolved status post aspirin  and nitroglycerin  - He remains asymptomatic, he has been ambulatory without recurrence of symptoms. - Troponin negative x 2, EKG is not ischemic, x-ray stable.  He is tolerating p.o., feels back to normal - He has history of known CAD, his symptoms today do not appear consistent with cardiac ischemia.  His heart score is 3-4, negative troponin x 2.  He is asymptomatic. - Given resolution of symptoms and reassuring workup, will have pt follow-up with cardiology in the office  Prior [facility] 9/24, moderate to severe multivessel CAD, antianginal therapy was increased, consideration of future PCI if symptoms persist   Clinical Course as of 03/12/24 1507  Mon Mar 12, 2024  1448 He remains asymptomatic  [SG]    Clinical Course User Index [SG] Elnor Jayson LABOR, DO     3:07 PM:  I have discussed the diagnosis/risks/treatment options with the patient and family.  Evaluation and diagnostic testing in the emergency department does not suggest an emergent condition requiring admission or immediate intervention beyond what has been performed at this time.  They will follow up with cardiology/pcp. We also discussed returning to the ED immediately if new or worsening sx occur. We discussed the sx which are most concerning (e.g., sudden worsening pain, fever, inability to  tolerate by mouth) that necessitate immediate return.    The patient appears reasonably screened and/or stabilized for discharge and I doubt any other medical condition or other [facility] requiring further screening, evaluation, or treatment in the ED at this time prior to discharge.                 Additional history obtained: -Additional history obtained from family -External records from outside source obtained and reviewed including: Chart review including previous notes, labs, imaging, consultation notes including  Prior cardiology documentation, home medications   Lab Tests: -I ordered, reviewed, and interpreted labs.   The pertinent results include:   Labs Reviewed  BASIC METABOLIC PANEL WITH GFR - Abnormal; Notable for the following components:      Result Value   Chloride 96 (*)    Glucose, Bld 108 (*)    All other components within normal limits  CBC WITH DIFFERENTIAL/PLATELET - Abnormal; Notable for the following components:   WBC 16.6 (*)    Neutro Abs 13.1 (*)    Monocytes Absolute 1.1 (*)    Abs Immature Granulocytes 0.08 (*)    All other components within normal limits  CBG MONITORING, ED - Abnormal; Notable for the following components:   Glucose-Capillary 109 (*)    All other components within normal limits  RESP PANEL BY RT-PCR (RSV, FLU A&B, COVID)  RVPGX2  MAGNESIUM  LIPASE, BLOOD  HEPATIC FUNCTION PANEL  TROPONIN T, HIGH SENSITIVITY  TROPONIN T, HIGH SENSITIVITY    Notable for labs are stable  EKG   EKG Interpretation Date/Time:  Monday March 12 2024 11:32:10 EST Ventricular Rate:  72 PR Interval:  140 QRS Duration:  98 QT Interval:  403 QTC Calculation: 441 R Axis:   66  Text Interpretation: Sinus rhythm Ventricular premature complex Low voltage, precordial leads Nonspecific T abnormalities, inferior leads Confirmed by Elnor Jayson ([id number]) on 03/12/2024 12:13:00 PM         Imaging Studies ordered: I ordered imaging studies including  x-ray I independently visualized the following imaging with scope of interpretation  limited to determining acute life threatening conditions related to emergency care; findings noted above I agree with the radiologist interpretation If any imaging was obtained with contrast I closely monitored patient for any possible adverse reaction a/w contrast administration in the emergency department   Medicines ordered and prescription drug management: Meds ordered this encounter  Medications   oxyCODONE -acetaminophen  (PERCOCET/ROXICET) 5-325 MG per tablet 1 tablet    Refill:  0   sodium chloride  0.9 % bolus 1,000 mL    -I have reviewed the patients home medicines and have made adjustments as needed   Consultations Obtained: Not applicable  Cardiac Monitoring: The patient was maintained on a cardiac monitor.  I personally viewed and interpreted the cardiac monitored which showed an underlying rhythm of: NSR Continuous pulse oximetry interpreted by myself, 100% on room air.    Social Determinants of Health:  Diagnosis or treatment significantly limited by social determinants of health: former smoker   Reevaluation: After the interventions noted above, I reevaluated the patient and found that they have resolved  Co morbidities that complicate the patient evaluation  Past Medical History:  Diagnosis Date   Headache    Hypercholesteremia    on meds   Hypertension    on meds   Sleep apnea    uses CPAP nightly   Stroke [facility]) 2014      Dispostion: Disposition decision including need for hospitalization was considered, and patient discharged from emergency department.    Final Clinical Impression(s) / ED Diagnoses Final diagnoses:  Stable angina  Near syncope         [1]  Social History Tobacco Use   Smoking status: Former    Current packs/day: 0.00    Average packs/day: 1.0 packs/day    Types: Cigarettes    Start date: 05/31/2022    Quit date: 05/31/2022    Years  since quitting: 1.7   Smokeless tobacco: Never   Tobacco comments:    Vapes daily   Vaping Use   Vaping status: Every Day  Substance Use Topics   Alcohol use: Yes    Alcohol/week: 0.0 - 1.0 standard drinks of alcohol    Comment: occasional   Drug use: No     Elnor Jayson LABOR, DO 03/12/24 1508  "

## 2024-03-12 NOTE — ED Notes (Signed)
 CCMD called for continuous cardiac monitoring.

## 2024-03-16 NOTE — Progress Notes (Unsigned)
 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  History of Present Illness He experienced an episode of chest pain while working outside on wood siding. He felt overheated, broke out in a cold sweat, and developed a migraine and jaw discomfort. The chest pain ensued, prompting him to take two baby aspirin  as he did not have his nitroglycerin  tablets with him. Despite this, he continued to feel unwell and was driven home by his colleagues, where his wife called for an ambulance. Upon arrival at the hospital, he received fluids both in the ambulance and at the hospital. He had not eaten that morning and had only consumed a salad the night before. The chest pain subsided by the time he reached home after taking a nitroglycerin  tablet, which caused his blood pressure to drop significantly. Since the episode, he has been resting and has not experienced any further chest pain, despite continuing some activities. He monitors his blood pressure at home using an automated device. Family history is significant for heart disease and hypertension, with relatives experiencing heart attacks  as early as his forties. No further chest pain since the episode. He felt exhausted and weak during the episode. No current symptoms of chest pain or shortness of breath.   ROS: Please see the history of present illness. All other systems are reviewed and negative.    PHYSICAL EXAM: VS:  BP 124/80 (BP Location: Left Arm, Patient Position: Sitting)   Pulse 82   Ht 5' 11 (1.803 m)   Wt 284 lb (128.8 kg)   SpO2 98%   BMI 39.61 kg/m  , BMI Body mass index is 39.61 kg/m. GEN: Well nourished, well developed, in no acute distress HEENT: normal Neck: no JVD, carotid bruits, or masses Cardiac: RRR; no murmurs, rubs, or gallops,no edema  Respiratory:  CTAB bilaterally, normal work of breathing GI: soft, nontender, nondistended, + BS Extremities: No LE edema Skin: warm and dry, no rash Neuro:  Strength and sensation are intact  Recent Labs: Reviewed  Studies: Reviewed Assessment & Plan Coronary artery disease with stable angina Recent exertional chest pain likely due to dehydration and exhaustion. No ER-detected heart abnormalities. No subsequent chest pain. Could have been due to stress-induced CP.  - Ordered echocardiogram to assess heart function. - Advised to report any recurrence of chest pain or symptoms. - Discussed potential medication adjustments if symptoms recur. - Plan for genetic testing given early ASCVD  Primary hypertension Recommended home blood pressure monitoring with wrist cuff for accuracy. No recent hypertension-related symptoms. - Provided wrist blood pressure cuff for home monitoring. - Provided blood pressure log  for recording readings. - Advised to report any consistently high blood pressure readings.   Signed, Joelle VEAR Ren Donley, MD  03/20/2024 8:47 AM    [facility] "

## 2024-03-20 ENCOUNTER — Ambulatory Visit

## 2024-03-20 ENCOUNTER — Other Ambulatory Visit (HOSPITAL_COMMUNITY): Payer: Self-pay

## 2024-03-20 VITALS — BP 124/80 | HR 82 | Ht 71.0 in | Wt 284.0 lb

## 2024-03-20 DIAGNOSIS — I10 Essential (primary) hypertension: Secondary | ICD-10-CM | POA: Diagnosis not present

## 2024-03-20 DIAGNOSIS — R0789 Other chest pain: Secondary | ICD-10-CM | POA: Diagnosis not present

## 2024-03-20 DIAGNOSIS — G4733 Obstructive sleep apnea (adult) (pediatric): Secondary | ICD-10-CM | POA: Insufficient documentation

## 2024-03-20 DIAGNOSIS — E785 Hyperlipidemia, unspecified: Secondary | ICD-10-CM | POA: Diagnosis not present

## 2024-03-20 DIAGNOSIS — E782 Mixed hyperlipidemia: Secondary | ICD-10-CM | POA: Diagnosis not present

## 2024-03-20 DIAGNOSIS — I25118 Atherosclerotic heart disease of native coronary artery with other forms of angina pectoris: Secondary | ICD-10-CM | POA: Diagnosis not present

## 2024-03-20 DIAGNOSIS — Z8673 Personal history of transient ischemic attack (TIA), and cerebral infarction without residual deficits: Secondary | ICD-10-CM | POA: Diagnosis not present

## 2024-03-20 MED ORDER — OMRON 3 SERIES BP MONITOR DEVI
0 refills | Status: AC
  Filled 2024-03-20: qty 1, 30d supply, fill #0

## 2024-03-20 NOTE — Patient Instructions (Addendum)
 Medication Instructions:  Your physician recommends that you continue on your current medications as directed. Please refer to the Current Medication list given to you today.  *If you need a refill on your cardiac medications before your next appointment, please call your pharmacy*     Testing/Procedures: Genetic Testing     Your physician has requested that you have an echocardiogram. Echocardiography is a painless test that uses sound waves to create images of your heart. It provides your doctor with information about the size and shape of your heart and how well your hearts chambers and valves are working. This procedure takes approximately one hour. There are no restrictions for this procedure. Please do NOT wear cologne, perfume, aftershave, or lotions (deodorant is allowed). Please arrive 15 minutes prior to your appointment time.  Please note: We ask at that you not bring children with you during ultrasound (echo/ vascular) testing. Due to room size and safety concerns, children are not allowed in the ultrasound rooms during exams. Our front office staff cannot provide observation of children in our lobby area while testing is being conducted. An adult accompanying a patient to their appointment will only be allowed in the ultrasound room at the discretion of the ultrasound technician under special circumstances. We apologize for any inconvenience.   Follow-Up: At [facility], you and your health needs are our priority.  As part of our continuing mission to provide you with exceptional heart care, our providers are all part of one team.  This team includes your primary Cardiologist (physician) and Advanced Practice Providers or APPs (Physician Assistants and Nurse Practitioners) who all work together to provide you with the care you need, when you need it.  Your next appointment:   3 month(s)  Provider:   Joelle VEAR Ren Donley, MD    We recommend signing up for the  patient portal called MyChart.  Sign up information is provided on this After Visit Summary.  MyChart is used to connect with patients for Virtual Visits (Telemedicine).  Patients are able to view lab/test results, encounter notes, upcoming appointments, etc.  Non-urgent messages can be sent to your provider as well.   To learn more about what you can do with MyChart, go to [email or web address].   Other Instructions Monitor your blood pressure and bring readings to your next appointment.  We need to get a better idea of what your blood pressure is running at home. Here are some instructions to follow: - I would recommend using a blood pressure cuff that goes on your arm. The wrist ones can be inaccurate. If you're purchasing one for the first time, try to select one that also reports your heart rate because this can be helpful information as well. - To check your blood pressure, choose a time at least 3 hours after taking your blood pressure medicines. If you can sample it at different times of the day, that's great - it might give you more information about how your blood pressure fluctuates. Remain seated in a chair for 5 minutes quietly beforehand, then check it.  - Please record a list of those readings and call us /send in [employer].

## 2024-03-28 ENCOUNTER — Other Ambulatory Visit: Payer: Self-pay | Admitting: Internal Medicine

## 2024-03-28 DIAGNOSIS — Z8673 Personal history of transient ischemic attack (TIA), and cerebral infarction without residual deficits: Secondary | ICD-10-CM

## 2024-03-28 DIAGNOSIS — R0789 Other chest pain: Secondary | ICD-10-CM

## 2024-03-28 DIAGNOSIS — I25118 Atherosclerotic heart disease of native coronary artery with other forms of angina pectoris: Secondary | ICD-10-CM

## 2024-03-28 DIAGNOSIS — E782 Mixed hyperlipidemia: Secondary | ICD-10-CM

## 2024-03-28 NOTE — Telephone Encounter (Unsigned)
 Copied from CRM [id number]. Topic: Clinical - Medication Refill >> Mar 28, 2024  2:48 PM Deleta S wrote: Medication: lisinopril -hydrochlorothiazide  (PRINZIDE ,ZESTORETIC ) 20-25 MG tablet Medication, nitroGLYCERIN  (NITROSTAT ) 0.4 MG SL tablet     Has the patient contacted their pharmacy? Yes (Agent: If no, request that the patient contact the pharmacy for the refill. If patient does not wish to contact the pharmacy document the reason why and proceed with request.) (Agent: If yes, when and what did the pharmacy advise?)  This is the patient's preferred pharmacy:  [facility] [address] [place], [place] - [address] [place] [address] Phone: [phone] Fax: [phone]  Is this the correct pharmacy for this prescription? Yes If no, delete pharmacy and type the correct one.   Has the prescription been filled recently? No  Is the patient out of the medication? Yes  Has the patient been seen for an appointment in the last year OR does the patient have an upcoming appointment? Yes  Can we respond through MyChart? Yes  Agent: Please be advised that Rx refills may take up to 3 business days. We ask that you follow-up with your pharmacy.

## 2024-03-29 MED ORDER — LISINOPRIL-HYDROCHLOROTHIAZIDE 20-25 MG PO TABS: 1.0000 | ORAL_TABLET | Freq: Every day | ORAL | 1 refills | Status: AC

## 2024-04-12 ENCOUNTER — Ambulatory Visit (HOSPITAL_COMMUNITY): Admission: RE | Admit: 2024-04-12 | Source: Ambulatory Visit

## 2024-04-24 ENCOUNTER — Telehealth: Payer: Medicaid Other | Admitting: Family Medicine

## 2024-06-11 ENCOUNTER — Ambulatory Visit

## 2024-06-13 ENCOUNTER — Ambulatory Visit (INDEPENDENT_AMBULATORY_CARE_PROVIDER_SITE_OTHER): Payer: Self-pay | Admitting: Primary Care
